# Patient Record
Sex: Female | Born: 1980 | Race: Black or African American | Hispanic: No | Marital: Single | Smoking: Current every day smoker
Health system: Southern US, Community
[De-identification: ages and names within clinical notes are randomized; demographics above are authoritative.]

## PROBLEM LIST (undated history)

## (undated) ENCOUNTER — Ambulatory Visit: Admission: EM | Payer: Self-pay

## (undated) ENCOUNTER — Ambulatory Visit: Admission: EM | Payer: BC Managed Care – PPO | Source: Home / Self Care

## (undated) DIAGNOSIS — I10 Essential (primary) hypertension: Secondary | ICD-10-CM

## (undated) DIAGNOSIS — D259 Leiomyoma of uterus, unspecified: Secondary | ICD-10-CM

## (undated) DIAGNOSIS — N920 Excessive and frequent menstruation with regular cycle: Secondary | ICD-10-CM

## (undated) DIAGNOSIS — N83209 Unspecified ovarian cyst, unspecified side: Secondary | ICD-10-CM

## (undated) DIAGNOSIS — K4090 Unilateral inguinal hernia, without obstruction or gangrene, not specified as recurrent: Secondary | ICD-10-CM

## (undated) DIAGNOSIS — U071 COVID-19: Secondary | ICD-10-CM

## (undated) DIAGNOSIS — D649 Anemia, unspecified: Secondary | ICD-10-CM

## (undated) HISTORY — DX: Excessive and frequent menstruation with regular cycle: N92.0

## (undated) HISTORY — PX: OVARIAN CYST SURGERY: SHX726

## (undated) HISTORY — DX: Unspecified ovarian cyst, unspecified side: N83.209

## (undated) HISTORY — DX: Leiomyoma of uterus, unspecified: D25.9

## (undated) HISTORY — DX: Unilateral inguinal hernia, without obstruction or gangrene, not specified as recurrent: K40.90

---

## 2007-01-29 ENCOUNTER — Emergency Department: Payer: Self-pay | Admitting: Emergency Medicine

## 2007-09-27 ENCOUNTER — Ambulatory Visit: Payer: Self-pay | Admitting: Family Medicine

## 2007-11-01 ENCOUNTER — Emergency Department: Payer: Self-pay | Admitting: Emergency Medicine

## 2012-11-22 ENCOUNTER — Ambulatory Visit: Payer: Self-pay | Admitting: Emergency Medicine

## 2013-10-20 ENCOUNTER — Ambulatory Visit: Payer: Self-pay | Admitting: Physician Assistant

## 2013-10-20 LAB — URINALYSIS, COMPLETE
Bilirubin,UR: NEGATIVE
Blood: NEGATIVE
Glucose,UR: NEGATIVE mg/dL (ref 0–75)
Ketone: NEGATIVE
NITRITE: POSITIVE
Ph: 7.5 (ref 4.5–8.0)
Protein: NEGATIVE
Specific Gravity: 1.005 (ref 1.003–1.030)

## 2013-10-22 LAB — URINE CULTURE

## 2013-11-21 ENCOUNTER — Ambulatory Visit: Payer: Self-pay | Admitting: Family Medicine

## 2013-11-21 LAB — PREGNANCY, URINE: Pregnancy Test, Urine: NEGATIVE m[IU]/mL

## 2020-08-09 ENCOUNTER — Ambulatory Visit
Admission: EM | Admit: 2020-08-09 | Discharge: 2020-08-09 | Disposition: A | Discharge status: Home / Self Care | Payer: BC Managed Care – PPO | Attending: Family Medicine | Admitting: Family Medicine

## 2020-08-09 ENCOUNTER — Other Ambulatory Visit: Payer: Self-pay

## 2020-08-09 DIAGNOSIS — U071 COVID-19: Secondary | ICD-10-CM

## 2020-08-09 LAB — RESP PANEL BY RT-PCR (FLU A&B, COVID) ARPGX2
Influenza A by PCR: NEGATIVE
Influenza B by PCR: NEGATIVE
SARS Coronavirus 2 by RT PCR: POSITIVE — AB

## 2020-08-09 MED ORDER — BENZONATATE 200 MG PO CAPS: 200.0000 mg | ORAL_CAPSULE | Freq: Three times a day (TID) | ORAL | 0 refills | Status: DC | PRN

## 2020-08-09 NOTE — Discharge Instructions (Addendum)
We will call with the results.  Cough medication as directed.  Take care  Dr. Lacinda Axon

## 2020-08-09 NOTE — ED Provider Notes (Signed)
[facility]    CSN: [id number] Arrival date & time: 08/09/20  1452      History   Chief Complaint Chief Complaint  Patient presents with  . Cough    HPI  39 year old female presents with Covid symptoms.  Patient reports her symptoms started on Christmas day.  She reports productive cough, congestion, runny nose, left ear pain, body aches, hot and cold spells.  Denies shortness of breath, fever, nausea, vomit, diarrhea, abdominal pain.  No relieving factors.  She is concerned she may have Covid.  Desires testing today.  No other complaints.  Home Medications    Prior to Admission medications   Medication Sig Start Date End Date Taking? Authorizing Provider  benzonatate (TESSALON) 200 MG capsule Take 1 capsule (200 mg total) by mouth 3 (three) times daily as needed for cough. 08/09/20  Yes Malachy Coleman G, DO  ibuprofen (ADVIL) 600 MG tablet 200 mg   Yes [provider]    Family History Family History  Problem Relation Age of Onset  . Healthy Mother     Social History Social History   Tobacco Use  . Smoking status: Current Every Day Smoker    Packs/day: 0.50    Types: Cigars  . Smokeless tobacco: Never Used  Vaping Use  . Vaping Use: Never used  Substance Use Topics  . Alcohol use: Yes  . Drug use: Never     Allergies   Hornet venom   Review of Systems Review of Systems Per HPI  Physical Exam Triage Vital Signs ED Triage Vitals  Enc Vitals Group     BP 08/09/20 1842 (!) 129/95     Pulse Rate 08/09/20 1842 89     Resp 08/09/20 1842 18     Temp 08/09/20 1842 99.1 F (37.3 C)     Temp Source 08/09/20 1842 Oral     SpO2 08/09/20 1842 100 %     Weight --      Height --      Head Circumference --      Peak Flow --      Pain Score 08/09/20 1836 4     Pain Loc --      Pain Edu? --      Excl. in [place]? --    Updated Vital Signs BP (!) 129/95 (BP Location: Left Wrist)   Pulse 89   Temp 99.1 F (37.3 C) (Oral)   Resp 18   LMP  07/22/2020   SpO2 100%   Visual Acuity Right Eye Distance:   Left Eye Distance:   Bilateral Distance:    Right Eye Near:   Left Eye Near:    Bilateral Near:     Physical Exam Vitals and nursing note reviewed.  Constitutional:      General: She is not in acute distress.    Appearance: Normal appearance. She is not ill-appearing.  HENT:     Head: Normocephalic and atraumatic.  Cardiovascular:     Rate and Rhythm: Normal rate and regular rhythm.     Heart sounds: No murmur heard.   Pulmonary:     Effort: Pulmonary effort is normal.     Breath sounds: Normal breath sounds. No wheezing, rhonchi or rales.  Neurological:     Mental Status: She is alert.  Psychiatric:        Mood and Affect: Mood normal.        Behavior: Behavior normal.    UC Treatments / Results  Labs (  all labs ordered are listed, but only abnormal results are displayed) Labs Reviewed  RESP PANEL BY RT-PCR (FLU A&B, COVID) ARPGX2 - Abnormal; Notable for the following components:      Result Value   SARS Coronavirus 2 by RT PCR POSITIVE (*)    All other components within normal limits    EKG   Radiology No results found.  Procedures Procedures (including critical care time)  Medications Ordered in UC Medications - No data to display  Initial Impression / Assessment and Plan / UC Course  I have reviewed the triage vital signs and the nursing notes.  Pertinent labs & imaging results that were available during my care of the patient were reviewed by me and considered in my medical decision making (see chart for details).    39 year old female presents with COVID-19.  Stable at this time.  Tessalon Perles as prescribed.  Information sent to infusion clinic.  Final Clinical Impressions(s) / UC Diagnoses   Final diagnoses:  COVID     Discharge Instructions     We will call with the results.  Cough medication as directed.  Take care  Dr. Lacinda Axon     ED Prescriptions    Medication Sig  Dispense Auth. Provider   benzonatate (TESSALON) 200 MG capsule Take 1 capsule (200 mg total) by mouth 3 (three) times daily as needed for cough. 30 capsule Coral Spikes, DO     PDMP not reviewed this encounter.   Coral Spikes, [place] 08/09/20 2150

## 2020-08-09 NOTE — ED Triage Notes (Signed)
Pt c/o productive cough with clear/white sputum, congestion, runny nose intermittent left ear pain for approx 3 days.  Had [place] screening on Thursday for employment, but was asymptomatic at the time (result pending).  Denies SOB, fever, n/v/d, abdominal pain, sore throat.

## 2020-08-09 NOTE — ED Notes (Signed)
Called pt for tx room assignment. Pt states she went home but "will drive back". Advised pt that we will have to assign room to pt who is present on campus. Pt can return and be called when a room is available, but must be present to have room assignment.

## 2020-08-18 ENCOUNTER — Ambulatory Visit
Admission: EM | Admit: 2020-08-18 | Discharge: 2020-08-18 | Disposition: A | Discharge status: Home / Self Care | Payer: BC Managed Care – PPO | Attending: Emergency Medicine | Admitting: Emergency Medicine

## 2020-08-18 ENCOUNTER — Other Ambulatory Visit: Payer: Self-pay

## 2020-08-18 DIAGNOSIS — U071 COVID-19: Secondary | ICD-10-CM

## 2020-08-18 MED ORDER — BENZONATATE 100 MG PO CAPS: 200.0000 mg | ORAL_CAPSULE | Freq: Three times a day (TID) | ORAL | 0 refills | Status: DC

## 2020-08-18 NOTE — Discharge Instructions (Addendum)
Use the Tessalon Perles every 8 hours as needed for cough.  Your Covid symptoms of fatigue and cough can last for 6 weeks or longer after you recover from COVID-19.  If your fatigue and cough last longer than 6 weeks to talk to your primary care provider about being referred to one of the Covid long-haul clinics.

## 2020-08-18 NOTE — ED Provider Notes (Signed)
[facility]    CSN: [id number] Arrival date & time: 08/18/20  1703      History   Chief Complaint Chief Complaint  Patient presents with  . Covid Positive    [phone]    HPI Nicole [name] is a 40 y.o. female.   HPI   40 year old female here for evaluation of continued cough and fatigue.  Patient is on day 10 of her Covid quarantine of [place] posted back to work tomorrow.  Patient says she is just not quite feeling 100%.  Patient denies fever, shortness of breath, or wheezing.  History reviewed. No pertinent past medical history.  There are no problems to display for this patient.   History reviewed. No pertinent surgical history.  OB History   No obstetric history on file.      Home Medications    Prior to Admission medications   Medication Sig Start Date End Date Taking? Authorizing Provider  benzonatate (TESSALON) 100 MG capsule Take 2 capsules (200 mg total) by mouth every 8 (eight) hours. 08/18/20  Yes Margarette Canada, NP    Family History Family History  Problem Relation Age of Onset  . Healthy Mother     Social History Social History   Tobacco Use  . Smoking status: Current Every Day Smoker    Packs/day: 0.50    Types: Cigars  . Smokeless tobacco: Never Used  Vaping Use  . Vaping Use: Never used  Substance Use Topics  . Alcohol use: Yes  . Drug use: Never     Allergies   Hornet venom   Review of Systems Review of Systems  Constitutional: Positive for fatigue.  Respiratory: Positive for cough. Negative for shortness of breath.   Cardiovascular: Negative for chest pain.  Neurological: Negative for headaches.  Hematological: Negative.   Psychiatric/Behavioral: Negative.      Physical Exam Triage Vital Signs ED Triage Vitals  Enc Vitals Group     BP 08/18/20 2015 135/87     Pulse Rate 08/18/20 2015 95     Resp 08/18/20 2015 18     Temp 08/18/20 2015 98.4 F (36.9 C)     Temp src --      SpO2 08/18/20 2015 100 %      Weight 08/18/20 1900 200 lb (90.7 kg)     Height 08/18/20 1900 5\' 6"  (1.676 m)     Head Circumference --      Peak Flow --      Pain Score 08/18/20 1859 5     Pain Loc --      Pain Edu? --      Excl. in [place]? --    No data found.  Updated Vital Signs BP 135/87 (BP Location: Right Arm)   Pulse 95   Temp 98.4 F (36.9 C)   Resp 18   Ht 5\' 6"  (1.676 m)   Wt 200 lb (90.7 kg)   LMP 07/22/2020   SpO2 100%   BMI 32.28 kg/m   Visual Acuity Right Eye Distance:   Left Eye Distance:   Bilateral Distance:    Right Eye Near:   Left Eye Near:    Bilateral Near:     Physical Exam Vitals and nursing note reviewed.  Constitutional:      General: She is not in acute distress.    Appearance: Normal appearance. She is normal weight. She is not toxic-appearing.  HENT:     Head: Normocephalic and atraumatic.  Cardiovascular:  Rate and Rhythm: Normal rate and regular rhythm.     Pulses: Normal pulses.     Heart sounds: Normal heart sounds. No murmur heard. No gallop.   Pulmonary:     Effort: Pulmonary effort is normal.     Breath sounds: Normal breath sounds. No wheezing, rhonchi or rales.  Skin:    General: Skin is warm and dry.     Capillary Refill: Capillary refill takes less than 2 seconds.  Neurological:     General: No focal deficit present.     Mental Status: She is alert and oriented to person, place, and time.  Psychiatric:        Mood and Affect: Mood normal.        Behavior: Behavior normal.        Thought Content: Thought content normal.        Judgment: Judgment normal.      UC Treatments / Results  Labs (all labs ordered are listed, but only abnormal results are displayed) Labs Reviewed - No data to display  EKG   Radiology No results found.  Procedures Procedures (including critical care time)  Medications Ordered in UC Medications - No data to display  Initial Impression / Assessment and Plan / UC Course  I have reviewed the triage vital  signs and the nursing notes.  Pertinent labs & imaging results that were available during my care of the patient were reviewed by me and considered in my medical decision making (see chart for details).   Patient is here for evaluation of continued cough and fatigue after 10 days of Covid.  Patient denies fever, shortness of breath or wheezing.  Patient is in no acute distress, not tachypneic, and lung sounds are clear in all lung fields.  Discussed with patient that cough and fatigue can last for 6 weeks or longer after Covid diagnosis.   Final Clinical Impressions(s) / UC Diagnoses   Final diagnoses:  COVID-19     Discharge Instructions     Use the Tessalon Perles every 8 hours as needed for cough.  Your Covid symptoms of fatigue and cough can last for 6 weeks or longer after you recover from COVID-19.  If your fatigue and cough last longer than 6 weeks to talk to your primary care provider about being referred to one of the Covid long-haul clinics.    ED Prescriptions    Medication Sig Dispense Auth. Provider   benzonatate (TESSALON) 100 MG capsule Take 2 capsules (200 mg total) by mouth every 8 (eight) hours. 21 capsule Becky Augusta, NP     PDMP not reviewed this encounter.   Becky Augusta, NP 08/18/20 2122

## 2020-08-18 NOTE — ED Triage Notes (Signed)
Patient states that she has been having a cough with nasal congestion. States that she is covid positive and is on day 10 of symptoms. States that she is supposed to return to work tomorrow but isnt feeling 100%

## 2020-09-07 ENCOUNTER — Ambulatory Visit
Admission: EM | Admit: 2020-09-07 | Discharge: 2020-09-07 | Disposition: A | Discharge status: Home / Self Care | Payer: Medicaid Other | Attending: Family Medicine | Admitting: Family Medicine

## 2020-09-07 ENCOUNTER — Other Ambulatory Visit: Payer: Self-pay

## 2020-09-07 DIAGNOSIS — S83412A Sprain of medial collateral ligament of left knee, initial encounter: Secondary | ICD-10-CM

## 2020-09-07 MED ORDER — MELOXICAM 15 MG PO TABS: 15.0000 mg | ORAL_TABLET | Freq: Every day | ORAL | 0 refills | Status: DC | PRN

## 2020-09-07 NOTE — Discharge Instructions (Signed)
Rest, ice, elevation.  Medication as prescribed.  Take care  Dr. Lacinda Axon

## 2020-09-07 NOTE — ED Provider Notes (Signed)
[facility]    CSN: [id number] Arrival date & time: 09/07/20  1224  History   Chief Complaint Chief Complaint  Patient presents with  . Knee Pain   HPI  40 year old female presents with the above complaint.  Patient states that she slipped on some ice and had a fall last night.  She states that she fell on her bottom but her leg got underneath her.  She reports left knee pain.  Pain is located medially.  Patient states that she is concerned that she has sprained her knee.  Pain is 5/10 in severity.  No relieving factors.  No other reported symptoms.  No other complaints.   Home Medications    Prior to Admission medications   Medication Sig Start Date End Date Taking? Authorizing Provider  meloxicam (MOBIC) 15 MG tablet Take 1 tablet (15 mg total) by mouth daily as needed. 09/07/20  Yes Coral Spikes, DO    Family History Family History  Problem Relation Age of Onset  . Healthy Mother     Social History Social History   Tobacco Use  . Smoking status: Current Every Day Smoker    Packs/day: 0.50    Types: Cigars  . Smokeless tobacco: Never Used  Vaping Use  . Vaping Use: Never used  Substance Use Topics  . Alcohol use: Yes  . Drug use: Never     Allergies   Hornet venom   Review of Systems Review of Systems  Constitutional: Negative.   Musculoskeletal:       Left knee pain.   Physical Exam Triage Vital Signs ED Triage Vitals  Enc Vitals Group     BP 09/07/20 1232 (!) 145/84     Pulse Rate 09/07/20 1232 88     Resp 09/07/20 1232 18     Temp 09/07/20 1232 98.3 F (36.8 C)     Temp Source 09/07/20 1232 Oral     SpO2 09/07/20 1232 100 %     Weight 09/07/20 1232 203 lb (92.1 kg)     Height 09/07/20 1232 5\' 6"  (1.676 m)     Head Circumference --      Peak Flow --      Pain Score 09/07/20 1231 5     Pain Loc --      Pain Edu? --      Excl. in [place]? --    Updated Vital Signs BP (!) 145/84   Pulse 88   Temp 98.3 F (36.8 C) (Oral)   Resp  18   Ht 5\' 6"  (1.676 m)   Wt 92.1 kg   SpO2 100%   BMI 32.77 kg/m   Visual Acuity Right Eye Distance:   Left Eye Distance:   Bilateral Distance:    Right Eye Near:   Left Eye Near:    Bilateral Near:     Physical Exam Vitals and nursing note reviewed.  Constitutional:      General: She is not in acute distress.    Appearance: She is not ill-appearing.  HENT:     Head: Normocephalic and atraumatic.  Eyes:     General:        Right eye: No discharge.        Left eye: No discharge.     Conjunctiva/sclera: Conjunctivae normal.  Pulmonary:     Effort: Pulmonary effort is normal. No respiratory distress.  Musculoskeletal:     Comments: Left knee - tenderness medially. No effusion.  Neurological:  Mental Status: She is alert.  Psychiatric:        Mood and Affect: Mood normal.        Behavior: Behavior normal.    UC Treatments / Results  Labs (all labs ordered are listed, but only abnormal results are displayed) Labs Reviewed - No data to display  EKG   Radiology No results found.  Procedures Procedures (including critical care time)  Medications Ordered in UC Medications - No data to display  Initial Impression / Assessment and Plan / UC Course  I have reviewed the triage vital signs and the nursing notes.  Pertinent labs & imaging results that were available during my care of the patient were reviewed by me and considered in my medical decision making (see chart for details).    40 year old female presents with an MCL sprain. Advised rest, ice, elevation. Mobic as directed.   Final Clinical Impressions(s) / UC Diagnoses   Final diagnoses:  Sprain of medial collateral ligament of left knee, initial encounter     Discharge Instructions     Rest, ice, elevation.  Medication as prescribed.  Take care  Dr. Lacinda Axon    ED Prescriptions    Medication Sig Dispense Auth. Provider   meloxicam (MOBIC) 15 MG tablet Take 1 tablet (15 mg total) by mouth  daily as needed. 30 tablet Coral Spikes, DO     PDMP not reviewed this encounter.   Coral Spikes, [place] 09/07/20 1504

## 2020-09-07 NOTE — ED Triage Notes (Signed)
Pt reports having L knee pain s/p fall last night. sts knee feels like it is sprained. Tried to ice knee and use otc meds without relief.  Slip and fall due to the ice.

## 2020-09-11 ENCOUNTER — Ambulatory Visit
Admission: EM | Admit: 2020-09-11 | Discharge: 2020-09-11 | Disposition: A | Discharge status: Home / Self Care | Payer: Medicaid Other

## 2020-09-11 ENCOUNTER — Other Ambulatory Visit: Payer: Self-pay

## 2020-09-11 ENCOUNTER — Encounter: Payer: Self-pay | Admitting: Emergency Medicine

## 2020-09-11 DIAGNOSIS — S83412D Sprain of medial collateral ligament of left knee, subsequent encounter: Secondary | ICD-10-CM

## 2020-09-11 NOTE — ED Provider Notes (Signed)
[facility]    CSN: [id number] Arrival date & time: 09/11/20  1521      History   Chief Complaint Chief Complaint  Patient presents with  . Knee Pain    left    HPI Nicole [name] is a 40 y.o. female.   HPI   40 year old female here for reevaluation of left knee sprain.  Patient was seen and evaluated on 09/07/2020 and diagnosed with a medial collateral ligament sprain of her left knee.  Patient reports that initially there was swelling but since she has been keeping it elevated, taking the meloxicam, and resting the swelling has gone down and the pain has greatly improved.  Patient states that she does not feel any instability in her knee, does not have any numbness or tingling in her foot or lower leg, and has not been wearing a brace.  History reviewed. No pertinent past medical history.  There are no problems to display for this patient.   History reviewed. No pertinent surgical history.  OB History   No obstetric history on file.      Home Medications    Prior to Admission medications   Medication Sig Start Date End Date Taking? Authorizing Provider  meloxicam (MOBIC) 15 MG tablet Take 1 tablet (15 mg total) by mouth daily as needed. 09/07/20  Yes Coral Spikes, DO    Family History Family History  Problem Relation Age of Onset  . Healthy Mother     Social History Social History   Tobacco Use  . Smoking status: Current Every Day Smoker    Packs/day: 0.50    Types: Cigars  . Smokeless tobacco: Never Used  Vaping Use  . Vaping Use: Never used  Substance Use Topics  . Alcohol use: Yes  . Drug use: Never     Allergies   Hornet venom   Review of Systems Review of Systems  Constitutional: Negative for activity change, appetite change and fever.  Musculoskeletal: Positive for arthralgias. Negative for joint swelling and myalgias.  Skin: Negative for color change.  Neurological: Negative for weakness and numbness.  Hematological:  Negative.   Psychiatric/Behavioral: Negative.      Physical Exam Triage Vital Signs ED Triage Vitals  Enc Vitals Group     BP 09/11/20 1541 (!) 139/100     Pulse Rate 09/11/20 1541 85     Resp 09/11/20 1541 14     Temp 09/11/20 1541 98.9 F (37.2 C)     Temp Source 09/11/20 1541 Oral     SpO2 09/11/20 1541 100 %     Weight 09/11/20 1539 203 lb (92.1 kg)     Height 09/11/20 1539 5\' 6"  (1.676 m)     Head Circumference --      Peak Flow --      Pain Score 09/11/20 1538 5     Pain Loc --      Pain Edu? --      Excl. in [place]? --    No data found.  Updated Vital Signs BP (!) 139/100 (BP Location: Right Arm)   Pulse 85   Temp 98.9 F (37.2 C) (Oral)   Resp 14   Ht 5\' 6"  (1.676 m)   Wt 203 lb (92.1 kg)   LMP 09/07/2020 (Exact Date)   SpO2 100%   BMI 32.77 kg/m   Visual Acuity Right Eye Distance:   Left Eye Distance:   Bilateral Distance:    Right Eye Near:   Left  Eye Near:    Bilateral Near:     Physical Exam Vitals and nursing note reviewed.  Constitutional:      General: She is not in acute distress.    Appearance: Normal appearance. She is not ill-appearing.  Musculoskeletal:        General: Tenderness present. No swelling. Normal range of motion.  Skin:    General: Skin is warm.     Capillary Refill: Capillary refill takes less than 2 seconds.     Findings: No bruising, erythema or rash.  Neurological:     General: No focal deficit present.     Mental Status: She is alert and oriented to person, place, and time.  Psychiatric:        Mood and Affect: Mood normal.        Behavior: Behavior normal.        Thought Content: Thought content normal.        Judgment: Judgment normal.      UC Treatments / Results  Labs (all labs ordered are listed, but only abnormal results are displayed) Labs Reviewed - No data to display  EKG   Radiology No results found.  Procedures Procedures (including critical care time)  Medications Ordered in  UC Medications - No data to display  Initial Impression / Assessment and Plan / UC Course  I have reviewed the triage vital signs and the nursing notes.  Pertinent labs & imaging results that were available during my care of the patient were reviewed by me and considered in my medical decision making (see chart for details).   Is here for reevaluation of her left knee injury in order to return to work tomorrow.  Patient is in no acute distress.  Patient has very minimal tenderness to palpation over the medial collateral ligament of the left knee.  There is no edema, ecchymosis, or erythema present.  No popping or clicking with range of motion of the knee.  Anterior posterior drawer tests are negative.  No tenderness to the patella.  Has no tenderness with valgus stress application.  Patient does have mild tenderness with varus stress application.  Patient does not walk with a limping gait.  Patient is cleared to return to work.   Final Clinical Impressions(s) / UC Diagnoses   Final diagnoses:  Sprain of medial collateral ligament of left knee, subsequent encounter     Discharge Instructions     You are cleared to return to work.  I would recommend wearing an elastic knee brace for some added support until the pain completely resolves.  Continue to elevate your left knee as much as possible and at the end of your shift.  You may continue to apply ice at the end of your shift for 20 minutes at a time to decrease inflammation.  You may also continue to take the Mobic once daily as needed for controlled information.    ED Prescriptions    None     PDMP not reviewed this encounter.   Margarette Canada, NP 09/11/20 1616

## 2020-09-11 NOTE — Discharge Instructions (Addendum)
You are cleared to return to work.  I would recommend wearing an elastic knee brace for some added support until the pain completely resolves.  Continue to elevate your left knee as much as possible and at the end of your shift.  You may continue to apply ice at the end of your shift for 20 minutes at a time to decrease inflammation.  You may also continue to take the Mobic once daily as needed for controlled information.

## 2020-09-11 NOTE — ED Triage Notes (Signed)
Patient c/o ongoing pain in hier left knee.  Patient was seen here on 09/07/20 for fall and left knee injury.  Patient is suppose to return to work tomorrow and wants to make sure she is okay to return to work.

## 2020-11-11 ENCOUNTER — Ambulatory Visit
Admission: EM | Admit: 2020-11-11 | Discharge: 2020-11-11 | Disposition: A | Discharge status: Home / Self Care | Payer: BC Managed Care – PPO | Attending: Family Medicine | Admitting: Family Medicine

## 2020-11-11 ENCOUNTER — Other Ambulatory Visit: Payer: Self-pay

## 2020-11-11 DIAGNOSIS — B349 Viral infection, unspecified: Secondary | ICD-10-CM | POA: Diagnosis not present

## 2020-11-11 LAB — RAPID INFLUENZA A&B ANTIGENS
Influenza A (ARMC): NEGATIVE
Influenza B (ARMC): NEGATIVE

## 2020-11-11 MED ORDER — PANTOPRAZOLE SODIUM 40 MG PO TBEC: 40.0000 mg | DELAYED_RELEASE_TABLET | Freq: Every day | ORAL | 0 refills | Status: DC

## 2020-11-11 NOTE — ED Triage Notes (Signed)
Pt c/o body aches yesterday, nasal congestion and some abdominal cramps. Pt denies f/n/v/d, cough or other symptoms.

## 2020-11-11 NOTE — Discharge Instructions (Addendum)
Rest. Fluids.  Medication as prescribed.   Take care  Dr. Toy Samarin  

## 2020-11-11 NOTE — ED Provider Notes (Signed)
[facility]    CSN: [id number] Arrival date & time: 11/11/20  1658  History   Chief Complaint Chief Complaint  Patient presents with  . Abdominal Pain   HPI  40 year old female presents with abdominal pain.  Patient reports that her symptoms started yesterday.  She reports that she had some body aches and rested and seemed to improve.  She subsequently developed periumbilical abdominal pain.  No significant respiratory symptoms.  She reports subjective fever but no documented fever.  No reported sick contacts.  No other complaints.  Home Medications    Prior to Admission medications   Medication Sig Start Date End Date Taking? Authorizing Provider  pantoprazole (PROTONIX) 40 MG tablet Take 1 tablet (40 mg total) by mouth daily. 11/11/20  Yes Coral Spikes, DO    Family History Family History  Problem Relation Age of Onset  . Healthy Mother     Social History Social History   Tobacco Use  . Smoking status: Current Every Day Smoker    Packs/day: 0.50    Types: Cigars  . Smokeless tobacco: Never Used  Vaping Use  . Vaping Use: Never used  Substance Use Topics  . Alcohol use: Yes  . Drug use: Never     Allergies   Hornet venom   Review of Systems Review of Systems Per HPI  Physical Exam Triage Vital Signs ED Triage Vitals  Enc Vitals Group     BP 11/11/20 1717 (!) 141/96     Pulse Rate 11/11/20 1717 82     Resp 11/11/20 1717 18     Temp 11/11/20 1717 99.3 F (37.4 C)     Temp Source 11/11/20 1717 Oral     SpO2 11/11/20 1717 100 %     Weight 11/11/20 1715 205 lb (93 kg)     Height 11/11/20 1715 5\' 6"  (1.676 m)     Head Circumference --      Peak Flow --      Pain Score 11/11/20 1714 5     Pain Loc --      Pain Edu? --      Excl. in [name]? --    Updated Vital Signs BP (!) 141/96 (BP Location: Left Arm)   Pulse 82   Temp 99.3 F (37.4 C) (Oral)   Resp 18   Ht 5\' 6"  (1.676 m)   Wt 93 kg   LMP 11/02/2020 (Approximate)   SpO2 100%    BMI 33.09 kg/m   Visual Acuity Right Eye Distance:   Left Eye Distance:   Bilateral Distance:    Right Eye Near:   Left Eye Near:    Bilateral Near:     Physical Exam Vitals and nursing note reviewed.  Constitutional:      General: She is not in acute distress.    Appearance: Normal appearance. She is not ill-appearing.  HENT:     Head: Normocephalic and atraumatic.  Eyes:     General:        Right eye: No discharge.        Left eye: No discharge.     Conjunctiva/sclera: Conjunctivae normal.  Cardiovascular:     Rate and Rhythm: Normal rate and regular rhythm.  Pulmonary:     Effort: Pulmonary effort is normal.     Breath sounds: Normal breath sounds. No wheezing, rhonchi or rales.  Abdominal:     General: There is no distension.     Palpations: Abdomen is soft.  Comments: Mild epigastric tenderness to palpation.  Neurological:     Mental Status: She is alert.  Psychiatric:        Mood and Affect: Mood normal.        Behavior: Behavior normal.    UC Treatments / Results  Labs (all labs ordered are listed, but only abnormal results are displayed) Labs Reviewed  RAPID INFLUENZA A&B ANTIGENS    EKG   Radiology No results found.  Procedures Procedures (including critical care time)  Medications Ordered in UC Medications - No data to display  Initial Impression / Assessment and Plan / UC Course  I have reviewed the triage vital signs and the nursing notes.  Pertinent labs & imaging results that were available during my care of the patient were reviewed by me and considered in my medical decision making (see chart for details).    40 year old female presents with a viral illness.  Protonix as needed given her epigastric pain.  Flu negative.  Supportive care.  Work note given  Final Clinical Impressions(s) / UC Diagnoses   Final diagnoses:  Viral illness     Discharge Instructions     Rest. Fluids.  Medication as prescribed.  Take  care  Dr. Lacinda Axon    ED Prescriptions    Medication Sig Dispense Auth. Provider   pantoprazole (PROTONIX) 40 MG tablet Take 1 tablet (40 mg total) by mouth daily. 30 tablet Coral Spikes, DO     PDMP not reviewed this encounter.   Coral Spikes, [place] 11/11/20 1909

## 2020-11-15 ENCOUNTER — Encounter: Payer: Self-pay | Admitting: Emergency Medicine

## 2020-11-15 ENCOUNTER — Other Ambulatory Visit: Payer: Self-pay

## 2020-11-15 ENCOUNTER — Emergency Department
Admission: EM | Admit: 2020-11-15 | Discharge: 2020-11-16 | Disposition: A | Discharge status: Home / Self Care | Payer: BC Managed Care – PPO | Attending: Emergency Medicine | Admitting: Emergency Medicine

## 2020-11-15 ENCOUNTER — Ambulatory Visit (INDEPENDENT_AMBULATORY_CARE_PROVIDER_SITE_OTHER)
Admission: EM | Admit: 2020-11-15 | Discharge: 2020-11-15 | Disposition: A | Discharge status: Home / Self Care | Payer: BC Managed Care – PPO | Source: Home / Self Care

## 2020-11-15 DIAGNOSIS — R1909 Other intra-abdominal and pelvic swelling, mass and lump: Secondary | ICD-10-CM

## 2020-11-15 DIAGNOSIS — I10 Essential (primary) hypertension: Secondary | ICD-10-CM | POA: Insufficient documentation

## 2020-11-15 DIAGNOSIS — F17290 Nicotine dependence, other tobacco product, uncomplicated: Secondary | ICD-10-CM | POA: Diagnosis not present

## 2020-11-15 DIAGNOSIS — N939 Abnormal uterine and vaginal bleeding, unspecified: Secondary | ICD-10-CM | POA: Insufficient documentation

## 2020-11-15 DIAGNOSIS — R1903 Right lower quadrant abdominal swelling, mass and lump: Secondary | ICD-10-CM | POA: Insufficient documentation

## 2020-11-15 DIAGNOSIS — R1031 Right lower quadrant pain: Secondary | ICD-10-CM | POA: Insufficient documentation

## 2020-11-15 LAB — COMPREHENSIVE METABOLIC PANEL
ALT: 16 U/L (ref 0–44)
AST: 22 U/L (ref 15–41)
Albumin: 4.2 g/dL (ref 3.5–5.0)
Alkaline Phosphatase: 47 U/L (ref 38–126)
Anion gap: 7 (ref 5–15)
BUN: 12 mg/dL (ref 6–20)
CO2: 22 mmol/L (ref 22–32)
Calcium: 8.8 mg/dL — ABNORMAL LOW (ref 8.9–10.3)
Chloride: 106 mmol/L (ref 98–111)
Creatinine, Ser: 0.59 mg/dL (ref 0.44–1.00)
GFR, Estimated: >60 mL/min (ref >60)
Glucose, Bld: 102 mg/dL — ABNORMAL HIGH (ref 70–99)
Potassium: 3.7 mmol/L (ref 3.5–5.1)
Sodium: 135 mmol/L (ref 135–145)
Total Bilirubin: 0.4 mg/dL (ref 0.3–1.2)
Total Protein: 7.9 g/dL (ref 6.5–8.1)

## 2020-11-15 LAB — WET PREP, GENITAL
Clue Cells Wet Prep HPF POC: NONE SEEN
Sperm: NONE SEEN
Trich, Wet Prep: NONE SEEN
Yeast Wet Prep HPF POC: NONE SEEN

## 2020-11-15 LAB — URINALYSIS, ROUTINE W REFLEX MICROSCOPIC
Bacteria, UA: NONE SEEN
Bilirubin Urine: NEGATIVE
Glucose, UA: NEGATIVE mg/dL
Ketones, ur: NEGATIVE mg/dL
Leukocytes,Ua: NEGATIVE
Nitrite: NEGATIVE
Protein, ur: NEGATIVE mg/dL
Specific Gravity, Urine: 1.009 (ref 1.005–1.030)
pH: 7 (ref 5.0–8.0)

## 2020-11-15 LAB — URINALYSIS, COMPLETE (UACMP) WITH MICROSCOPIC

## 2020-11-15 LAB — CBC
HCT: 32 % — ABNORMAL LOW (ref 36.0–46.0)
Hemoglobin: 9.1 g/dL — ABNORMAL LOW (ref 12.0–15.0)
MCH: 20.6 pg — ABNORMAL LOW (ref 26.0–34.0)
MCHC: 28.4 g/dL — ABNORMAL LOW (ref 30.0–36.0)
MCV: 72.6 fL — ABNORMAL LOW (ref 80.0–100.0)
Platelets: 327 10*3/uL (ref 150–400)
RBC: 4.41 MIL/uL (ref 3.87–5.11)
RDW: 18.6 % — ABNORMAL HIGH (ref 11.5–15.5)
WBC: 6.8 10*3/uL (ref 4.0–10.5)
nRBC: 0 % (ref 0.0–0.2)

## 2020-11-15 LAB — POC URINE PREG, ED: Preg Test, Ur: NEGATIVE

## 2020-11-15 NOTE — ED Provider Notes (Addendum)
[facility]    CSN: [id number] Arrival date & time: 11/15/20  1848      History   Chief Complaint Chief Complaint  Patient presents with  . Abdominal Pain    RLQ    HPI Nicole [name] is a 40 y.o. female.   HPI   40 year old female here for evaluation of abdominal pain and vaginal bleeding.  Patient reports that she is having pain and has a tender swollen area in her right lower quadrant of her abdomen that started 4 days ago.  Patient reports that she has also had some vaginal bleeding.  Patient reports that she had a normal menses on 11/02/2020 that lasted 7 days.  Her menses had finished for 2 days and then the painful swelling developed in her right lower quadrant and she started having vaginal bleeding again.  Patient denies fever, urinary complaints, or vaginal discharge.  Patient states she has a history of ovarian cysts and is concerned that this may be a cyst.  Patient also has a history of uterine fibroids which give her heavy menstrual cycles.  History reviewed. No pertinent past medical history.  There are no problems to display for this patient.   History reviewed. No pertinent surgical history.  OB History   No obstetric history on file.      Home Medications    Prior to Admission medications   Medication Sig Start Date End Date Taking? Authorizing Provider  pantoprazole (PROTONIX) 40 MG tablet Take 1 tablet (40 mg total) by mouth daily. 11/11/20 11/15/20  Coral Spikes, DO    Family History Family History  Problem Relation Age of Onset  . Healthy Mother     Social History Social History   Tobacco Use  . Smoking status: Current Every Day Smoker    Packs/day: 0.50    Types: Cigars  . Smokeless tobacco: Never Used  Vaping Use  . Vaping Use: Never used  Substance Use Topics  . Alcohol use: Yes  . Drug use: Never     Allergies   Hornet venom   Review of Systems Review of Systems  Constitutional: Negative for fever.   Respiratory: Negative for shortness of breath.   Gastrointestinal: Positive for abdominal pain. Negative for diarrhea, nausea and vomiting.  Genitourinary: Positive for menstrual problem and vaginal bleeding. Negative for dysuria, frequency, hematuria, pelvic pain, urgency, vaginal discharge and vaginal pain.  Skin: Negative for rash.  Neurological: Negative for dizziness and syncope.  Hematological: Negative.   Psychiatric/Behavioral: Negative.      Physical Exam Triage Vital Signs ED Triage Vitals  Enc Vitals Group     BP      Pulse      Resp      Temp      Temp src      SpO2      Weight      Height      Head Circumference      Peak Flow      Pain Score      Pain Loc      Pain Edu?      Excl. in [place]?    No data found.  Updated Vital Signs BP 125/87 (BP Location: Left Arm)   Pulse 95   Temp 99.2 F (37.3 C) (Oral)   Resp 14   Ht 5\' 6"  (1.676 m)   Wt 205 lb (93 kg)   LMP 11/02/2020 (Approximate)   SpO2 100%   BMI 33.09 kg/m  Visual Acuity Right Eye Distance:   Left Eye Distance:   Bilateral Distance:    Right Eye Near:   Left Eye Near:    Bilateral Near:     Physical Exam Vitals and nursing note reviewed.  Constitutional:      General: She is not in acute distress.    Appearance: She is well-developed. She is not ill-appearing.  HENT:     Head: Normocephalic and atraumatic.  Cardiovascular:     Rate and Rhythm: Normal rate and regular rhythm.     Heart sounds: Normal heart sounds. No murmur heard. No gallop.   Pulmonary:     Effort: Pulmonary effort is normal.     Breath sounds: Normal breath sounds. No wheezing, rhonchi or rales.  Abdominal:     General: Abdomen is flat. Bowel sounds are normal. There is no distension.     Palpations: Abdomen is soft. There is no hepatomegaly or splenomegaly.     Tenderness: There is no abdominal tenderness.     Hernia: No hernia is present.  Skin:    General: Skin is warm and dry.     Capillary Refill:  Capillary refill takes less than 2 seconds.     Findings: No erythema or rash.  Neurological:     General: No focal deficit present.     Mental Status: She is alert and oriented to person, place, and time.  Psychiatric:        Mood and Affect: Mood normal.        Behavior: Behavior normal.      UC Treatments / Results  Labs (all labs ordered are listed, but only abnormal results are displayed) Labs Reviewed  WET PREP, GENITAL - Abnormal; Notable for the following components:      Result Value   WBC, Wet Prep HPF POC FEW (*)    All other components within normal limits  URINALYSIS, COMPLETE (UACMP) WITH MICROSCOPIC - Abnormal; Notable for the following components:   Color, Urine ORANGE (*)    Glucose, UA   (*)    Value: TEST NOT REPORTED DUE TO COLOR INTERFERENCE OF URINE PIGMENT   Hgb urine dipstick   (*)    Value: TEST NOT REPORTED DUE TO COLOR INTERFERENCE OF URINE PIGMENT   Bilirubin Urine   (*)    Value: TEST NOT REPORTED DUE TO COLOR INTERFERENCE OF URINE PIGMENT   Ketones, ur   (*)    Value: TEST NOT REPORTED DUE TO COLOR INTERFERENCE OF URINE PIGMENT   Protein, ur   (*)    Value: TEST NOT REPORTED DUE TO COLOR INTERFERENCE OF URINE PIGMENT   Nitrite   (*)    Value: TEST NOT REPORTED DUE TO COLOR INTERFERENCE OF URINE PIGMENT   Leukocytes,Ua   (*)    Value: TEST NOT REPORTED DUE TO COLOR INTERFERENCE OF URINE PIGMENT   Bacteria, UA FEW (*)    All other components within normal limits    EKG   Radiology CT ABDOMEN PELVIS W CONTRAST  Result Date: 11/16/2020 CLINICAL DATA:  Right lower quadrant abdominal pain starting on Saturday. Vaginal bleeding and abdominal cramping. EXAM: CT ABDOMEN AND PELVIS WITH CONTRAST TECHNIQUE: Multidetector CT imaging of the abdomen and pelvis was performed using the standard protocol following bolus administration of intravenous contrast. CONTRAST:  188mL OMNIPAQUE IOHEXOL 300 MG/ML  SOLN COMPARISON:  None. FINDINGS: Lower chest: Motion  artifact limits examination. Lung bases appear clear. Hepatobiliary: No focal liver abnormality is seen. No gallstones,  gallbladder wall thickening, or biliary dilatation. Pancreas: Unremarkable. No pancreatic ductal dilatation or surrounding inflammatory changes. Spleen: Normal in size without focal abnormality. Adrenals/Urinary Tract: Adrenal glands are unremarkable. Kidneys are normal, without renal calculi, focal lesion, or hydronephrosis. Bladder is unremarkable. Stomach/Bowel: Stomach, small bowel, and colon are not abnormally distended. No wall thickening or inflammatory changes are appreciated. The appendix is normal. Vascular/Lymphatic: Normal caliber abdominal aorta. Scattered retroperitoneal and mesenteric lymph nodes without pathologic enlargement, likely reactive. Reproductive: Nodular enlargement of the uterus consistent with uterine fibroids. Largest measures about 6.4 cm in diameter. Right adnexal cyst measuring 4.4 cm diameter. Other: No free air or free fluid in the abdomen. Moderate supraumbilical anterior abdominal wall hernias containing fat. Heterogeneous mass like soft tissue lesion demonstrated in the right inguinal canal, measuring 3 x 4.6 cm. This could represent a hernia with fat necrosis, enlarged lymph node, or an inguinal mass lesion. No adjacent bowel is identified to suggest bowel herniation. Musculoskeletal: No acute or significant osseous findings. IMPRESSION: 1. Moderate supraumbilical anterior abdominal wall hernias containing fat. 2. Heterogeneous mass like soft tissue lesion in the right inguinal canal, measuring 3 x 4.6 cm. This could represent a hernia with fat necrosis, enlarged lymph node, or an inguinal mass lesion. Surgical consultation suggested. 3. Uterine fibroids. 4. Right adnexal cyst measuring 4.4 cm diameter. No follow-up imaging recommended. note: This recommendation does not apply to premenarchal patients and to those with increased risk (genetic, family history,  elevated tumor markers or other high-risk factors) of ovarian cancer. Reference: JACR 2020 Feb; 17(2):248-254 Electronically Signed   By: Lucienne Capers M.D.   On: 11/16/2020 02:10    Procedures Procedures (including critical care time)  Medications Ordered in UC Medications - No data to display  Initial Impression / Assessment and Plan / UC Course  I have reviewed the triage vital signs and the nursing notes.  Pertinent labs & imaging results that were available during my care of the patient were reviewed by me and considered in my medical decision making (see chart for details).   Patient is a very pleasant 40 year old female here for evaluation of abdominal pain and vaginal bleeding that been going on for last 4 days.  Patient's physical exam reveals pale conjunctiva and oral mucous membranes.  Patient reports she has a history of anemia and she just finished her menstrual cycle 6 days ago.  Patient's cardiopulmonary exam is unremarkable.  Abdomen is soft, nondistended, nontender, with positive bowel sounds in all 4 quadrants.  Patient has a palpable right suprapubic mass on exam that is tender to palpation.  Patient thinks that its an ovarian cyst as she has had these in the past.  UA and wet prep collected at triage.    UA unable to be performed due to color interference.  Results do show 6-10 squamous epithelials 0-5 WBCs, few bacteria.    Wet prep is negative for yeast, trichomoniasis, or clue cells.   Will obtain noncontrasted CT of the abdomen and pelvis.  Patient has been checked into the emergency department at [facility]  Patient was sent over to [facility] to obtain an outpatient CT scan of her abdomen pelvis and due to the late hour had to be registered to the ER.  Patient is listed as waiting in the waiting room.   Final Clinical Impressions(s) / UC Diagnoses   Final diagnoses:  Right lower quadrant abdominal pain    Discharge Instructions   None  ED Prescriptions    None     PDMP not reviewed this encounter.   Margarette Canada, NP 11/15/20 2019    Margarette Canada, NP 11/16/20 [phone]

## 2020-11-15 NOTE — ED Triage Notes (Signed)
Patient c/o RLQ pain that started on Saturday. Patient reports some vaginal bleeding and abdominal cramping.  Patient states that she has history of ovarian cysts.  Patient denies any vaginal discharge.

## 2020-11-15 NOTE — ED Triage Notes (Addendum)
Pt in with co lump to right groin area states since Saturday. Did have viral infection last week, went to urgent care of sent here for further evaluation. PT also having some lower abd cramping and some vaginal bleeding.

## 2020-11-16 ENCOUNTER — Emergency Department: Payer: BC Managed Care – PPO

## 2020-11-16 DIAGNOSIS — R1903 Right lower quadrant abdominal swelling, mass and lump: Secondary | ICD-10-CM | POA: Diagnosis not present

## 2020-11-16 MED ORDER — IOHEXOL 300 MG/ML  SOLN
100.0000 mL | Freq: Once | INTRAMUSCULAR | Status: AC | PRN
  Administered 2020-11-16: 100 mL via INTRAVENOUS

## 2020-11-16 NOTE — ED Provider Notes (Signed)
[facility] Emergency Department Provider Note  ____________________________________________   Event Date/Time   First MD Initiated Contact with Patient 11/15/20 2301     (approximate)  I have reviewed the triage vital signs and the nursing notes.   HISTORY  Chief Complaint Groin Pain    HPI Nicole [name] is a 40 y.o. female with no significant past medical history who presents to the emergency department with a mass in the right lower quadrant that she has noticed for the past 4 days.  Was seen today by nurse practitioner urgent care was sent here for CT of the abdomen pelvis.  She has also had some intermittent vaginal bleeding but states that she has very irregular menstrual cycles and this is not abnormal for her.  No fevers, nausea, vomiting, diarrhea, dysuria, hematuria, vaginal discharge.  States this area is slightly tender to palpation but she denies any pain when this area is not being palpated.  She reports normal bowel movements.        No past medical history on file.  There are no problems to display for this patient.   No past surgical history on file.  Prior to Admission medications   Medication Sig Start Date End Date Taking? Authorizing Provider  pantoprazole (PROTONIX) 40 MG tablet Take 1 tablet (40 mg total) by mouth daily. 11/11/20 11/15/20  Coral Spikes, DO    Allergies Hornet venom  Family History  Problem Relation Age of Onset  . Healthy Mother     Social History Social History   Tobacco Use  . Smoking status: Current Every Day Smoker    Packs/day: 0.50    Types: Cigars  . Smokeless tobacco: Never Used  Vaping Use  . Vaping Use: Never used  Substance Use Topics  . Alcohol use: Yes  . Drug use: Never    Review of Systems Constitutional: No fever. Eyes: No visual changes. ENT: No sore throat. Cardiovascular: Denies chest pain. Respiratory: Denies shortness of breath. Gastrointestinal: No nausea, vomiting,  diarrhea. Genitourinary: Negative for dysuria. Musculoskeletal: Negative for back pain. Skin: Negative for rash. Neurological: Negative for focal weakness or numbness.  ____________________________________________   PHYSICAL EXAM:  VITAL SIGNS: ED Triage Vitals  Enc Vitals Group     BP 11/15/20 2019 (!) 141/101     Pulse Rate 11/15/20 2019 86     Resp 11/15/20 2019 20     Temp 11/15/20 2019 98.5 F (36.9 C)     Temp Source 11/15/20 2019 Oral     SpO2 11/15/20 2019 100 %     Weight 11/15/20 2020 205 lb (93 kg)     Height 11/15/20 2020 5\' 6"  (1.676 m)     Head Circumference --      Peak Flow --      Pain Score 11/15/20 2019 0     Pain Loc --      Pain Edu? --      Excl. in [place]? --    CONSTITUTIONAL: Alert and oriented and responds appropriately to questions. Well-appearing; well-nourished HEAD: Normocephalic EYES: Conjunctivae clear, pupils appear equal, EOM appear intact ENT: normal nose; moist mucous membranes NECK: Supple, normal ROM CARD: RRR; S1 and S2 appreciated; no murmurs, no clicks, no rubs, no gallops RESP: Normal chest excursion without splinting or tachypnea; breath sounds clear and equal bilaterally; no wheezes, no rhonchi, no rales, no hypoxia or respiratory distress, speaking full sentences ABD/GI: Normal bowel sounds; non-distended; soft, no rebound, no guarding, no  peritoneal signs, no hepatosplenomegaly, patient has a right inguinal hernia on exam that I am not able to reduce, mildly tender to palpation over the right hernia with no overlying skin changes BACK: The back appears normal EXT: Normal ROM in all joints; no deformity noted, no edema; no cyanosis SKIN: Normal color for age and race; warm; no rash on exposed skin NEURO: Moves all extremities equally PSYCH: The patient's mood and manner are appropriate.  ____________________________________________   LABS (all labs ordered are listed, but only abnormal results are displayed)  Labs Reviewed   CBC - Abnormal; Notable for the following components:      Result Value   Hemoglobin 9.1 (*)    HCT 32.0 (*)    MCV 72.6 (*)    MCH 20.6 (*)    MCHC 28.4 (*)    RDW 18.6 (*)    All other components within normal limits  COMPREHENSIVE METABOLIC PANEL - Abnormal; Notable for the following components:   Glucose, Bld 102 (*)    Calcium 8.8 (*)    All other components within normal limits  URINALYSIS, ROUTINE W REFLEX MICROSCOPIC - Abnormal; Notable for the following components:   Color, Urine YELLOW (*)    APPearance CLEAR (*)    Hgb urine dipstick MODERATE (*)    All other components within normal limits  POC URINE PREG, ED   ____________________________________________  EKG  none ____________________________________________  RADIOLOGY I, Rossy Virag, personally viewed and evaluated these images (plain radiographs) as part of my medical decision making, as well as reviewing the written report by the radiologist.  ED MD interpretation: Fat-containing hernia versus inguinal mass versus enlarged lymph node.  Official radiology report(s): CT ABDOMEN PELVIS W CONTRAST  Result Date: 11/16/2020 CLINICAL DATA:  Right lower quadrant abdominal pain starting on Saturday. Vaginal bleeding and abdominal cramping. EXAM: CT ABDOMEN AND PELVIS WITH CONTRAST TECHNIQUE: Multidetector CT imaging of the abdomen and pelvis was performed using the standard protocol following bolus administration of intravenous contrast. CONTRAST:  141mL OMNIPAQUE IOHEXOL 300 MG/ML  SOLN COMPARISON:  None. FINDINGS: Lower chest: Motion artifact limits examination. Lung bases appear clear. Hepatobiliary: No focal liver abnormality is seen. No gallstones, gallbladder wall thickening, or biliary dilatation. Pancreas: Unremarkable. No pancreatic ductal dilatation or surrounding inflammatory changes. Spleen: Normal in size without focal abnormality. Adrenals/Urinary Tract: Adrenal glands are unremarkable. Kidneys are normal,  without renal calculi, focal lesion, or hydronephrosis. Bladder is unremarkable. Stomach/Bowel: Stomach, small bowel, and colon are not abnormally distended. No wall thickening or inflammatory changes are appreciated. The appendix is normal. Vascular/Lymphatic: Normal caliber abdominal aorta. Scattered retroperitoneal and mesenteric lymph nodes without pathologic enlargement, likely reactive. Reproductive: Nodular enlargement of the uterus consistent with uterine fibroids. Largest measures about 6.4 cm in diameter. Right adnexal cyst measuring 4.4 cm diameter. Other: No free air or free fluid in the abdomen. Moderate supraumbilical anterior abdominal wall hernias containing fat. Heterogeneous mass like soft tissue lesion demonstrated in the right inguinal canal, measuring 3 x 4.6 cm. This could represent a hernia with fat necrosis, enlarged lymph node, or an inguinal mass lesion. No adjacent bowel is identified to suggest bowel herniation. Musculoskeletal: No acute or significant osseous findings. IMPRESSION: 1. Moderate supraumbilical anterior abdominal wall hernias containing fat. 2. Heterogeneous mass like soft tissue lesion in the right inguinal canal, measuring 3 x 4.6 cm. This could represent a hernia with fat necrosis, enlarged lymph node, or an inguinal mass lesion. Surgical consultation suggested. 3. Uterine fibroids. 4. Right adnexal cyst  measuring 4.4 cm diameter. No follow-up imaging recommended. note: This recommendation does not apply to premenarchal patients and to those with increased risk (genetic, family history, elevated tumor markers or other high-risk factors) of ovarian cancer. Reference: JACR 2020 Feb; 17(2):248-254 Electronically Signed   By: Lucienne Capers M.D.   On: 11/16/2020 02:10    ____________________________________________   PROCEDURES  Procedure(s) performed (including Critical Care):  Procedures  ____________________________________________   INITIAL IMPRESSION /  ASSESSMENT AND PLAN / ED COURSE  As part of my medical decision making, I reviewed the following data within the [facility] notes reviewed and incorporated, Labs reviewed , Old chart reviewed, A consult was requested and obtained from this/these consultant(s) Surgery, Notes from prior ED visits and [place] Controlled Substance Database         Patient sent here from urgent care for concerns for an mass in her right lower quadrant.  Patient has a right inguinal hernia on exam that I am not able to reduce.  She has no history of previous hernias.  She does not have any symptoms of a bowel obstruction.  She is well-appearing and declines pain medication.  We will proceed with CT imaging to evaluate if there is any incarcerated bowel in this hernia.  Her labs, urine are unremarkable today.  She does report intermittent vaginal bleeding but states that her menstrual cycles are very irregular and that this is not abnormal for her to have bleeding several times in a month.  No abnormal discharge.  ED PROGRESS  Patient CT scan shows fat-containing inguinal hernia with necrosis versus enlarged lymph node versus inguinal mass lesion.  Discussed with Dr. Celine Ahr on-call for general surgery who has reviewed patient's imaging.  She suspects that this is likely an inguinal fat-containing hernia but agrees with outpatient follow-up.  Will have patient call Dr. Glenford Peers office in the morning to schedule follow-up.  I feel patient is safe to be discharged home.  She is also comfortable with this plan.  Discussed return precautions.  Patient has been intermittently hypertensive here.  She is asymptomatic.  I have recommended close follow-up with a primary care doctor for this.  At this time, I do not feel there is any life-threatening condition present. I have reviewed, interpreted and discussed all results (EKG, imaging, lab, urine as appropriate) and exam findings with patient/family. I have  reviewed nursing notes and appropriate previous records.  I feel the patient is safe to be discharged home without further emergent workup and can continue workup as an outpatient as needed. Discussed usual and customary return precautions. Patient/family verbalize understanding and are comfortable with this plan.  Outpatient follow-up has been provided as needed. All questions have been answered.  ____________________________________________   FINAL CLINICAL IMPRESSION(S) / ED DIAGNOSES  Final diagnoses:  Mass of right inguinal region  Hypertension, unspecified type     ED Discharge Orders    None      *Please note:  Nicole [name] was evaluated in Emergency Department on 11/16/2020 for the symptoms described in the history of present illness. She was evaluated in the context of the global COVID-19 pandemic, which necessitated consideration that the patient might be at risk for infection with the SARS-CoV-2 virus that causes COVID-19. Institutional protocols and algorithms that pertain to the evaluation of patients at risk for COVID-19 are in a state of rapid change based on information released by regulatory bodies including the CDC and federal and state organizations. These policies and  algorithms were followed during the patient's care in the ED.  Some ED evaluations and interventions may be delayed as a result of limited staffing during and the pandemic.*   Note:  This document was prepared using Dragon voice recognition software and may include unintentional dictation errors.   Sarahjane Matherly, Delice Bison, DO 11/16/20 [phone]

## 2020-11-16 NOTE — Discharge Instructions (Addendum)
You were found to have a mass in your right inguinal area.  This is likely from a fat-containing hernia.  We recommend close follow-up with general surgery.  Please call in the morning to schedule an appointment.  Your labs, urine otherwise were unremarkable.  You may alternate Tylenol 1000 mg every 6 hours as needed for pain, fever and Ibuprofen 800 mg every 8 hours as needed for pain, fever.  Please take Ibuprofen with food.  Do not take more than 4000 mg of Tylenol (acetaminophen) in a 24 hour period.  You were also noted to be hypertensive intermittently in the ED.  Recommend close follow-up with a primary care physician for this.  If you develop chest pain, vision changes, severe headache, numbness or weakness, changes in speech, confusion, please return to the ED.  Steps to find a Primary Care Provider (PCP):  Call [phone] or [phone] to access "[facility]."  2.  You may also go on the [facility] website at [email or web address]

## 2020-11-16 NOTE — ED Notes (Signed)
Patient transported to CT 

## 2020-11-19 ENCOUNTER — Encounter: Payer: Self-pay | Admitting: Emergency Medicine

## 2020-11-19 ENCOUNTER — Ambulatory Visit
Admission: EM | Admit: 2020-11-19 | Discharge: 2020-11-19 | Disposition: A | Discharge status: Home / Self Care | Payer: BC Managed Care – PPO

## 2020-11-19 ENCOUNTER — Other Ambulatory Visit: Payer: Self-pay

## 2020-11-19 DIAGNOSIS — Z8742 Personal history of other diseases of the female genital tract: Secondary | ICD-10-CM

## 2020-11-19 NOTE — ED Provider Notes (Signed)
[facility]    CSN: [id number] Arrival date & time: 11/19/20  1550      History   Chief Complaint Chief Complaint  Patient presents with  . Letter for School/Work    HPI Nicole [name] is a 40 y.o. female.   HPI   40 year old female here for follow-up on her CT scan of her abdomen pelvis.  Patient was evaluated here 4 days ago and was sent to the emergency department where she had a CT scan of her abdomen that showed moderate super umbilical abdominal wall hernias containing fat, a heterogenous masslike soft tissue lesion in the right inguinal canal that could represent an hernia with fat necrosis, enlarged lymph node, or inguinal mass, uterine fibroids, and a right adnexal cyst measuring 4.4 cm.  Patient has a follow-up appointment with Dr. Fredirick Maudlin with general surgery on 12/02/2020.  Patient had questions as to why her periods have been so heavy and how long she might be out following surgery.  History reviewed. No pertinent past medical history.  There are no problems to display for this patient.   History reviewed. No pertinent surgical history.  OB History   No obstetric history on file.      Home Medications    Prior to Admission medications   Medication Sig Start Date End Date Taking? Authorizing Provider  pantoprazole (PROTONIX) 40 MG tablet Take 1 tablet (40 mg total) by mouth daily. 11/11/20 11/15/20  Coral Spikes, DO    Family History Family History  Problem Relation Age of Onset  . Healthy Mother     Social History Social History   Tobacco Use  . Smoking status: Current Every Day Smoker    Packs/day: 0.50    Types: Cigars  . Smokeless tobacco: Never Used  Vaping Use  . Vaping Use: Never used  Substance Use Topics  . Alcohol use: Yes  . Drug use: Never     Allergies   Hornet venom   Review of Systems Review of Systems  Constitutional: Negative for fever.  Gastrointestinal: Positive for abdominal pain. Negative for  nausea and vomiting.  Genitourinary: Positive for vaginal bleeding.  Skin: Negative for rash.  Hematological: Negative.   Psychiatric/Behavioral: Negative.      Physical Exam Triage Vital Signs ED Triage Vitals  Enc Vitals Group     BP 11/19/20 1629 (!) 111/97     Pulse Rate 11/19/20 1629 72     Resp 11/19/20 1629 14     Temp 11/19/20 1629 99 F (37.2 C)     Temp Source 11/19/20 1629 Oral     SpO2 11/19/20 1629 97 %     Weight 11/19/20 1626 205 lb 0.4 oz (93 kg)     Height 11/19/20 1626 5\' 6"  (1.676 m)     Head Circumference --      Peak Flow --      Pain Score 11/19/20 1626 4     Pain Loc --      Pain Edu? --      Excl. in [place]? --    No data found.  Updated Vital Signs BP (!) 111/97 (BP Location: Left Arm)   Pulse 72   Temp 99 F (37.2 C) (Oral)   Resp 14   Ht 5\' 6"  (1.676 m)   Wt 205 lb 0.4 oz (93 kg)   LMP 11/02/2020 (Approximate)   SpO2 97%   BMI 33.09 kg/m   Visual Acuity Right Eye Distance:  Left Eye Distance:   Bilateral Distance:    Right Eye Near:   Left Eye Near:    Bilateral Near:     Physical Exam Vitals and nursing note reviewed.  Constitutional:      General: She is not in acute distress.    Appearance: Normal appearance.  HENT:     Head: Normocephalic and atraumatic.  Skin:    General: Skin is warm and dry.     Capillary Refill: Capillary refill takes less than 2 seconds.     Findings: No erythema or rash.  Neurological:     General: No focal deficit present.     Mental Status: She is alert and oriented to person, place, and time.  Psychiatric:        Mood and Affect: Mood normal.        Behavior: Behavior normal.        Thought Content: Thought content normal.        Judgment: Judgment normal.      UC Treatments / Results  Labs (all labs ordered are listed, but only abnormal results are displayed) Labs Reviewed - No data to display  EKG   Radiology No results found.  Procedures Procedures (including critical care  time)  Medications Ordered in UC Medications - No data to display  Initial Impression / Assessment and Plan / UC Course  I have reviewed the triage vital signs and the nursing notes.  Pertinent labs & imaging results that were available during my care of the patient were reviewed by me and considered in my medical decision making (see chart for details).   Patient is here for clarification of findings from her CT from 4 days ago.  Patient advised that she needs to follow-up with surgery as previously scheduled in order to determine the course of action for surgery, after surgery, and how long she may be out for recovery.  Patient also advised that her heavy menstrual cycles are most likely due to her uterine fibroids and that she needs to follow-up with her gynecologist for definitive treatment in those regards.  Patient is in no acute distress at this time and states that she feels comfortable with the responses she was given today.  Work note provided.   Final Clinical Impressions(s) / UC Diagnoses   Final diagnoses:  H/O menorrhagia     Discharge Instructions     As we discussed, you need to discuss your heavy menstrual cycle and your uterine fibroids with your gynecologist.  Keep your appointment with general surgery on the 21[facility] Seg[address] discussed with them the plan for surgery and how long anticipate you will be out to recover.      ED Prescriptions    None     PDMP not reviewed this encounter.   Margarette Canada, NP 11/19/20 1705

## 2020-11-19 NOTE — ED Triage Notes (Addendum)
Patient has no complaints today.  Patient just had a couple of questions to ask the provider today and needs a work note for today.   Patient has her surgical appointment on 12/03/20.

## 2020-11-19 NOTE — Discharge Instructions (Addendum)
As we discussed, you need to discuss your heavy menstrual cycle and your uterine fibroids with your gynecologist.  Keep your appointment with general surgery on the 21st Seg[facility] discussed with them the plan for surgery and how long anticipate you will be out to recover.

## 2020-12-02 ENCOUNTER — Encounter: Payer: Self-pay | Admitting: General Surgery

## 2020-12-02 ENCOUNTER — Other Ambulatory Visit: Payer: Self-pay

## 2020-12-02 ENCOUNTER — Ambulatory Visit (INDEPENDENT_AMBULATORY_CARE_PROVIDER_SITE_OTHER): Payer: BC Managed Care – PPO | Admitting: General Surgery

## 2020-12-02 VITALS — BP 151/91 | HR 79 | Temp 97.9°F | Ht 66.0 in | Wt 193.4 lb

## 2020-12-02 DIAGNOSIS — R1909 Other intra-abdominal and pelvic swelling, mass and lump: Secondary | ICD-10-CM

## 2020-12-02 NOTE — Patient Instructions (Addendum)
A CT has been schedule for Dec 14, 2020 @ 1 pm (arrive by 12:45) at the [facility] in [place]. Nothing to eat or drink 4 hours prior and pick up contrast between now and the day before the scan.

## 2020-12-02 NOTE — Progress Notes (Signed)
Patient ID: Nicole [name], female   DOB: [birth year], 40 y.o.   MRN: [id number]  Chief Complaint  Patient presents with  . New Patient (Initial Visit)    Right inguinal hernia    HPI Nicole [name] is a 40 y.o. female.   She is here today as follow-up from an emergency room visit on November 15, 2020.  She had presented to the urgent care in [facility] with complaints of abdominal pain and vaginal bleeding.  I have copied the history of present illness from the urgent care provider here:  "HPI   40 year old female here for evaluation of abdominal pain and vaginal bleeding.  Patient reports that she is having pain and has a tender swollen area in her right lower quadrant of her abdomen that started 4 days ago.  Patient reports that she has also had some vaginal bleeding.  Patient reports that she had a normal menses on 11/02/2020 that lasted 7 days.  Her menses had finished for 2 days and then the painful swelling developed in her right lower quadrant and she started having vaginal bleeding again.  Patient denies fever, urinary complaints, or vaginal discharge.  Patient states she has a history of ovarian cysts and is concerned that this may be a cyst.  Patient also has a history of uterine fibroids which give her heavy menstrual cycles."  A CT scan of the abdomen and pelvis was ordered, but due to the time of day of the visit, she had to present to [facility] for the study and was seen in the emergency department.  The ED provider reviewed the CT scan and contacted me, as I was on call that evening.  The notes from the emergency department physician are partially copied here:  "HPI Nicole [name] is a 40 y.o. female with no significant past medical history who presents to the emergency department with a mass in the right lower quadrant that she has noticed for the past 4 days.  Was seen today by nurse practitioner urgent care was sent here for CT of the abdomen pelvis.  She has also had some intermittent  vaginal bleeding but states that she has very irregular menstrual cycles and this is not abnormal for her.  No fevers, nausea, vomiting, diarrhea, dysuria, hematuria, vaginal discharge.  States this area is slightly tender to palpation but she denies any pain when this area is not being palpated.  She reports normal bowel movements."  "ED PROGRESS  Patient CT scan shows fat-containing inguinal hernia with necrosis versus enlarged lymph node versus inguinal mass lesion.  Discussed with Dr. Celine Ahr on-call for general surgery who has reviewed patient's imaging.  She suspects that this is likely an inguinal fat-containing hernia but agrees with outpatient follow-up.  Will have patient call Dr. Glenford Peers office in the morning to schedule follow-up.  I feel patient is safe to be discharged home.  She is also comfortable with this plan."  Since this visit to the emergency room, Nicole [name] states that the mass has disappeared.  She gives further history stating that it has occurred with her past 2 menstrual cycles and always coincides with the timing of her period.  She states the discomfort that she feels when it is present is similar to menstrual cramping.  No associated nausea or vomiting.  No fevers or chills.  Normal bowel function.   Past Medical History:  Diagnosis Date  . Fibroid, uterine   . Inguinal hernia   . Menorrhagia   .  Ovarian cyst     History reviewed. No pertinent surgical history.  Family History  Problem Relation Age of Onset  . Healthy Mother     Social History Social History   Tobacco Use  . Smoking status: Current Every Day Smoker    Packs/day: 0.50    Types: Cigars  . Smokeless tobacco: Never Used  Vaping Use  . Vaping Use: Never used  Substance Use Topics  . Alcohol use: Yes  . Drug use: Never    Allergies  Allergen Reactions  . Hornet Venom Swelling    No current outpatient medications on file.   No current facility-administered medications for this  visit.    Review of Systems Review of Systems  Constitutional: Positive for fatigue.  All other systems reviewed and are negative. Or as discussed in the history of present illness.  Blood pressure (!) 151/91, pulse 79, temperature 97.9 F (36.6 C), temperature source Oral, height 5\' 6"  (1.676 m), weight 193 lb 6.4 oz (87.7 kg), last menstrual period 11/22/2020, SpO2 99 %. Body mass index is 31.22 kg/m.  Physical Exam Physical Exam Constitutional:      General: She is not in acute distress.    Appearance: She is obese.  HENT:     Head: Normocephalic and atraumatic.     Nose:     Comments: Covered with a mask    Mouth/Throat:     Comments: Covered with a mask Eyes:     General: No scleral icterus.       Right eye: No discharge.        Left eye: No discharge.  Neck:     Comments: No palpable cervical or supraclavicular lymphadenopathy.  The trachea is midline.  No thyromegaly or dominant thyroid masses appreciated.  The gland moves freely with deglutition. Cardiovascular:     Rate and Rhythm: Normal rate and regular rhythm.     Pulses: Normal pulses.  Pulmonary:     Effort: Pulmonary effort is normal.     Breath sounds: Normal breath sounds.  Abdominal:     Palpations: Abdomen is soft.     Hernia: A hernia is present.     Comments: Soft, reducible epigastric hernia.  Nonpainful.  There is no inguinal hernia appreciated on exam today.  No palpable mass in the inguinal or femoral canal.  Genitourinary:    Comments: Deferred Musculoskeletal:        General: No swelling or tenderness.     Right lower leg: No edema.     Left lower leg: No edema.  Skin:    General: Skin is warm and dry.  Neurological:     General: No focal deficit present.     Mental Status: She is alert.  Psychiatric:        Mood and Affect: Mood normal.        Behavior: Behavior normal.     Data Reviewed I personally reviewed the imaging obtained during her emergency department evaluation, as  well as the documentation of both the urgent care and emergency department providers as copied in my history of present illness.        CLINICAL DATA:  Right lower quadrant abdominal pain starting on Saturday. Vaginal bleeding and abdominal cramping.  EXAM: CT ABDOMEN AND PELVIS WITH CONTRAST  TECHNIQUE: Multidetector CT imaging of the abdomen and pelvis was performed using the standard protocol following bolus administration of intravenous contrast.  CONTRAST:  148mL OMNIPAQUE IOHEXOL 300 MG/ML  SOLN  COMPARISON:  None.  FINDINGS: Lower chest: Motion artifact limits examination. Lung bases appear clear.  Hepatobiliary: No focal liver abnormality is seen. No gallstones, gallbladder wall thickening, or biliary dilatation.  Pancreas: Unremarkable. No pancreatic ductal dilatation or surrounding inflammatory changes.  Spleen: Normal in size without focal abnormality.  Adrenals/Urinary Tract: Adrenal glands are unremarkable. Kidneys are normal, without renal calculi, focal lesion, or hydronephrosis. Bladder is unremarkable.  Stomach/Bowel: Stomach, small bowel, and colon are not abnormally distended. No wall thickening or inflammatory changes are appreciated. The appendix is normal.  Vascular/Lymphatic: Normal caliber abdominal aorta. Scattered retroperitoneal and mesenteric lymph nodes without pathologic enlargement, likely reactive.  Reproductive: Nodular enlargement of the uterus consistent with uterine fibroids. Largest measures about 6.4 cm in diameter. Right adnexal cyst measuring 4.4 cm diameter.  Other: No free air or free fluid in the abdomen. Moderate supraumbilical anterior abdominal wall hernias containing fat. Heterogeneous mass like soft tissue lesion demonstrated in the right inguinal canal, measuring 3 x 4.6 cm. This could represent a hernia with fat necrosis, enlarged lymph node, or an inguinal mass lesion. No adjacent bowel is  identified to suggest bowel herniation.  Musculoskeletal: No acute or significant osseous findings.  IMPRESSION: 1. Moderate supraumbilical anterior abdominal wall hernias containing fat. 2. Heterogeneous mass like soft tissue lesion in the right inguinal canal, measuring 3 x 4.6 cm. This could represent a hernia with fat necrosis, enlarged lymph node, or an inguinal mass lesion. Surgical consultation suggested. 3. Uterine fibroids. 4. Right adnexal cyst measuring 4.4 cm diameter. No follow-up imaging recommended. note: This recommendation does not apply to premenarchal patients and to those with increased risk (genetic, family history, elevated tumor markers or other high-risk factors) of ovarian cancer. Reference: JACR 2020 Feb; 17(2):248-254  At the time but I was consulted by the emergency room provider, I felt that the imaging most likely represented a fat-containing inguinal hernia.  I do concur with the possibility of alternate diagnoses as discussed by the radiologist, above.  Assessment This is a 40 year old woman who was seen in the urgent care for right lower quadrant pain and heavy uterine bleeding.  Imaging was performed that demonstrated an asymptomatic epigastric hernia as well as a right inguinal mass.  On today's evaluation, the mass is not present.  The patient states that it went away after completing her menstrual cycle and has occurred previously with the same timing and results.  Although I initially believed this was a hernia, I am now less certain of this and am considering alternate possibilities, including endometriosis or lymphadenopathy.  Plan I discussed the possibility of reimaging to see if the area is still present, versus expectant management.  Ms. Washburn indicated that she would prefer to have the scan performed.  We will arrange this and I will communicate with her regarding the results.  If a hernia is indeed present, she would like to have it repaired.   We will await the results of the imaging and make further plans from there.    Fredirick Maudlin 12/02/2020, 2:54 PM

## 2020-12-07 ENCOUNTER — Ambulatory Visit: Payer: Self-pay | Admitting: General Surgery

## 2020-12-10 ENCOUNTER — Telehealth: Payer: Self-pay

## 2020-12-10 NOTE — Telephone Encounter (Signed)
I LVM for pt to pick contrast for her CT scan scheduled for 12/14/2020.

## 2020-12-14 ENCOUNTER — Ambulatory Visit: Admission: RE | Admit: 2020-12-14 | Payer: BC Managed Care – PPO | Source: Ambulatory Visit

## 2020-12-16 ENCOUNTER — Ambulatory Visit (HOSPITAL_COMMUNITY): Payer: BC Managed Care – PPO

## 2021-01-05 ENCOUNTER — Other Ambulatory Visit: Payer: Self-pay

## 2021-01-05 ENCOUNTER — Ambulatory Visit
Admission: EM | Admit: 2021-01-05 | Discharge: 2021-01-05 | Disposition: A | Discharge status: Home / Self Care | Payer: BC Managed Care – PPO | Attending: Sports Medicine | Admitting: Sports Medicine

## 2021-01-05 DIAGNOSIS — R42 Dizziness and giddiness: Secondary | ICD-10-CM

## 2021-01-05 NOTE — Discharge Instructions (Signed)
As we discussed, your blood pressure is 126/93.  It is not low and it is not high. I have given you educational handout on dizziness. Please make sure that you are keeping up on your fluids.  You are peeing clear.  Water and no coffee, teas, sodas. You can also let your primary care provider know that you were dizzy and seen in the urgent care and your exam was normal and your blood pressure was also normal. If symptoms persist or worsen please go to the emergency room. At your request I provided you a work note saying you were seen today.

## 2021-01-05 NOTE — ED Provider Notes (Signed)
[facility]    CSN: [id number] Arrival date & time: 01/05/21  1537      History   Chief Complaint Chief Complaint  Patient presents with  . Dizziness    HPI Nicole LOSEE is a 40 y.o. female.   Patient is a 40 year old female who presents for evaluation of the above issue.  Normally sees family care in [place] but could not get an appointment today.  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  She reports yesterday she felt a little bit dizzy.  It was only one episode.  The room was not spinning nor she when she was spinning.  She said that she has been keeping up on her fluid intake.  She says "I drink a lot of water".  She is concerned that her blood pressure is elevated as she is on blood pressure medication.  The only medicine she takes.  This morning she was a little bit lightheaded but nothing like yesterday.  She denies any fever shakes chills, no nausea vomiting or diarrhea.  No chest pain or shortness of breath.  No red flag signs or symptoms were elicited on history.     Past Medical History:  Diagnosis Date  . Fibroid, uterine   . Inguinal hernia   . Menorrhagia   . Ovarian cyst     There are no problems to display for this patient.   History reviewed. No pertinent surgical history.  OB History   No obstetric history on file.      Home Medications    Prior to Admission medications   Medication Sig Start Date End Date Taking? Authorizing Provider  irbesartan (AVAPRO) 150 MG tablet Take 150 mg by mouth daily. 08/18/20   [provider]  pantoprazole (PROTONIX) 40 MG tablet Take 1 tablet (40 mg total) by mouth daily. 11/11/20 11/15/20  Coral Spikes, DO    Family History Family History  Problem Relation Age of Onset  . Healthy Mother     Social History Social History   Tobacco Use  . Smoking status: Current Every Day Smoker    Packs/day: 0.50    Types: Cigars  . Smokeless tobacco: Never Used  Vaping Use  .  Vaping Use: Never used  Substance Use Topics  . Alcohol use: Yes  . Drug use: Never     Allergies   Hornet venom   Review of Systems Review of Systems  Constitutional: Negative for appetite change, chills, diaphoresis, fatigue and fever.  HENT: Negative for congestion, ear pain, postnasal drip, rhinorrhea, sinus pressure, sinus pain, sneezing and sore throat.   Eyes: Negative for pain.  Respiratory: Negative for cough, chest tightness and shortness of breath.   Cardiovascular: Negative for chest pain and palpitations.  Gastrointestinal: Negative for abdominal pain, diarrhea, nausea and vomiting.  Genitourinary: Negative for dysuria.  Musculoskeletal: Negative for back pain, myalgias and neck pain.  Skin: Negative for color change, pallor, rash and wound.  Neurological: Positive for dizziness and light-headedness. Negative for seizures, syncope, weakness, numbness and headaches.  All other systems reviewed and are negative.    Physical Exam Triage Vital Signs ED Triage Vitals  Enc Vitals Group     BP 01/05/21 1602 (!) 126/93     Pulse Rate 01/05/21 1602 79     Resp 01/05/21 1602 17     Temp 01/05/21 1602 98.4 F (36.9 C)     Temp Source 01/05/21 1602 Oral  SpO2 01/05/21 1602 100 %     Weight 01/05/21 1601 191 lb 12.8 oz (87 kg)     Height 01/05/21 1601 5\' 6"  (1.676 m)     Head Circumference --      Peak Flow --      Pain Score 01/05/21 1601 0     Pain Loc --      Pain Edu? --      Excl. in [place]? --    No data found.  Updated Vital Signs BP (!) 126/93 (BP Location: Right Arm)   Pulse 79   Temp 98.4 F (36.9 C) (Oral)   Resp 17   Ht 5\' 6"  (1.676 m)   Wt 87 kg   LMP 12/18/2020   SpO2 100%   BMI 30.96 kg/m   Visual Acuity Right Eye Distance:   Left Eye Distance:   Bilateral Distance:    Right Eye Near:   Left Eye Near:    Bilateral Near:     Physical Exam Vitals and nursing note reviewed.  Constitutional:      General: She is not in acute  distress.    Appearance: Normal appearance. She is not ill-appearing, toxic-appearing or diaphoretic.  HENT:     Head: Normocephalic and atraumatic.     Nose: Nose normal.     Mouth/Throat:     Mouth: Mucous membranes are moist.  Eyes:     General: No scleral icterus.       Right eye: No discharge.        Left eye: No discharge.     Extraocular Movements: Extraocular movements intact.     Conjunctiva/sclera: Conjunctivae normal.     Pupils: Pupils are equal, round, and reactive to light.  Cardiovascular:     Rate and Rhythm: Normal rate and regular rhythm.     Pulses: Normal pulses.     Heart sounds: Normal heart sounds. No murmur heard. No friction rub. No gallop.   Pulmonary:     Effort: Pulmonary effort is normal.     Breath sounds: Normal breath sounds. No stridor. No wheezing, rhonchi or rales.  Musculoskeletal:     Cervical back: Normal range of motion and neck supple. No rigidity.  Skin:    General: Skin is warm and dry.     Capillary Refill: Capillary refill takes less than 2 seconds.     Coloration: Skin is not jaundiced.     Findings: No bruising, erythema, lesion or rash.  Neurological:     General: No focal deficit present.     Mental Status: She is alert and oriented to person, place, and time.     Cranial Nerves: No cranial nerve deficit.     Sensory: No sensory deficit.     Motor: No weakness.     Coordination: Coordination normal.     Gait: Gait normal.     Deep Tendon Reflexes: Reflexes normal.  Psychiatric:        Mood and Affect: Mood normal.      UC Treatments / Results  Labs (all labs ordered are listed, but only abnormal results are displayed) Labs Reviewed - No data to display  EKG   Radiology No results found.  Procedures Procedures (including critical care time)  Medications Ordered in UC Medications - No data to display  Initial Impression / Assessment and Plan / UC Course  I have reviewed the triage vital signs and the nursing  notes.  Pertinent labs & imaging results that were  available during my care of the patient were reviewed by me and considered in my medical decision making (see chart for details).  Clinical impression: 1 episode of dizziness yesterday and a little lightheaded today.  Neurological examination as well as the entire physical examination are reassuring.  Blood pressure is normal.  Treatment plan: 1.  The findings and treatment plan were discussed in detail with the patient.  Patient was in agreement. 2.  I reassured her that her blood pressure was nothing to be concerned about.  Continue to take her blood pressure meds as prescribed. 3.  Advised her to let her primary care physician's office know of her symptoms and that she was seen in the urgent care and what her number was. 4.  Educational handouts provided. 5.  Plenty of rest, plenty of fluids.  Stay away from coffee teas and sodas.  Water preferred. 6.  Indicated that if her symptoms were to come back or worsen she should go to the ER. 7.  She requested a work note and at her request I went ahead and gave her [place] she was seen today. 8.  She was discharged in stable condition and she will follow-up here as needed.    Final Clinical Impressions(s) / UC Diagnoses   Final diagnoses:  Dizziness     Discharge Instructions     As we discussed, your blood pressure is 126/93.  It is not low and it is not high. I have given you educational handout on dizziness. Please make sure that you are keeping up on your fluids.  You are peeing clear.  Water and no coffee, teas, sodas. You can also let your primary care provider know that you were dizzy and seen in the urgent care and your exam was normal and your blood pressure was also normal. If symptoms persist or worsen please go to the emergency room. At your request I provided you a work note saying you were seen today.    ED Prescriptions    None     PDMP not reviewed this  encounter.   Verda Cumins, MD 01/05/21 [phone]

## 2021-01-05 NOTE — ED Triage Notes (Signed)
States that she has been having dizziness since yesterday and is concerned her BP may be elevated.

## 2021-02-16 ENCOUNTER — Other Ambulatory Visit: Payer: Self-pay

## 2021-02-16 ENCOUNTER — Ambulatory Visit
Admission: EM | Admit: 2021-02-16 | Discharge: 2021-02-16 | Disposition: A | Discharge status: Home / Self Care | Payer: BC Managed Care – PPO | Attending: Sports Medicine | Admitting: Sports Medicine

## 2021-02-16 DIAGNOSIS — R42 Dizziness and giddiness: Secondary | ICD-10-CM

## 2021-02-16 MED ORDER — MECLIZINE HCL 25 MG PO TABS: 25.0000 mg | ORAL_TABLET | Freq: Three times a day (TID) | ORAL | 0 refills | Status: DC | PRN

## 2021-02-16 NOTE — ED Triage Notes (Signed)
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

## 2021-02-16 NOTE — Discharge Instructions (Addendum)
As we discussed, I am treating you for vertigo and dizziness. I sent in a prescription to your pharmacy for vertigo.  Just use it as needed. Please hydrate aggressively with lots of water, Pedialyte, or Gatorade. Please see educational handouts. I provided a work note keep you out of work tomorrow and go back on Friday. If your symptoms persist please see your primary care provider. If they worsen then please go to the ER.

## 2021-02-17 NOTE — ED Provider Notes (Signed)
[facility]    CSN: [id number] Arrival date & time: 02/16/21  1920      History   Chief Complaint Chief Complaint  Patient presents with   Dizziness    HPI Nicole [name] is a 40 y.o. female.   Patient is a 40 year old female who presents for evaluation of some dizziness that began today.  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  She works at the [employer] and there is no air conditioning.  She thinks she may have gotten behind on her fluids and was a little bit dehydrated.  She said the room was spinning and it felt like she had a little bit of vertigo.  She is scheduled to go back into work and she does not feel as though she can go back in feeling the way she does.  She denies any headache or vision changes.  No URI symptoms.  No sore throat congestion cough chest pain or shortness of breath.  No abdominal or urinary symptoms.  No red flag signs or symptoms elicited on history.   Past Medical History:  Diagnosis Date   Fibroid, uterine    Inguinal hernia    Menorrhagia    Ovarian cyst     There are no problems to display for this patient.   History reviewed. No pertinent surgical history.  OB History   No obstetric history on file.      Home Medications    Prior to Admission medications   Medication Sig Start Date End Date Taking? Authorizing Provider  irbesartan (AVAPRO) 150 MG tablet Take 150 mg by mouth daily. 08/18/20  Yes [provider]  meclizine (ANTIVERT) 25 MG tablet Take 1 tablet (25 mg total) by mouth 3 (three) times daily as needed for dizziness. 02/16/21  Yes Verda Cumins, MD  pantoprazole (PROTONIX) 40 MG tablet Take 1 tablet (40 mg total) by mouth daily. 11/11/20 11/15/20  Coral Spikes, DO    Family History Family History  Problem Relation Age of Onset   Healthy Mother     Social History Social History   Tobacco Use   Smoking status: Every Day    Packs/day: 0.50    Pack years:  0.00    Types: Cigars, Cigarettes   Smokeless tobacco: Never  Vaping Use   Vaping Use: Never used  Substance Use Topics   Alcohol use: Yes   Drug use: Never     Allergies   Hornet venom   Review of Systems Review of Systems  Constitutional:  Negative for chills, diaphoresis, fatigue and fever.  HENT:  Negative for congestion, ear pain, postnasal drip, rhinorrhea, sinus pressure, sinus pain, sneezing and sore throat.   Eyes:  Negative for pain.  Respiratory:  Negative for cough, chest tightness and shortness of breath.   Cardiovascular:  Negative for chest pain and palpitations.  Gastrointestinal:  Negative for abdominal pain, diarrhea, nausea and vomiting.  Genitourinary:  Negative for dysuria.  Musculoskeletal:  Negative for back pain, myalgias and neck pain.  Skin:  Negative for color change, pallor, rash and wound.  Neurological:  Positive for dizziness and light-headedness. Negative for seizures, weakness, numbness and headaches.  All other systems reviewed and are negative.   Physical Exam Triage Vital Signs ED Triage Vitals  Enc Vitals Group     BP 02/16/21 1932 (!) 151/99     Pulse Rate 02/16/21 1932 85     Resp 02/16/21 1932 17  Temp 02/16/21 1932 99.2 F (37.3 C)     Temp Source 02/16/21 1932 Oral     SpO2 02/16/21 1932 100 %     Weight 02/16/21 1930 191 lb 12.8 oz (87 kg)     Height 02/16/21 1930 5\' 6"  (1.676 m)     Head Circumference --      Peak Flow --      Pain Score 02/16/21 1929 0     Pain Loc --      Pain Edu? --      Excl. in [place]? --    No data found.  Updated Vital Signs BP (!) 151/99 (BP Location: Left Arm)   Pulse 85   Temp 99.2 F (37.3 C) (Oral)   Resp 17   Ht 5\' 6"  (1.676 m)   Wt 87 kg   LMP 02/09/2021   SpO2 100%   BMI 30.96 kg/m   Visual Acuity Right Eye Distance:   Left Eye Distance:   Bilateral Distance:    Right Eye Near:   Left Eye Near:    Bilateral Near:     Physical Exam Constitutional:      General: She is  not in acute distress.    Appearance: Normal appearance. She is not ill-appearing, toxic-appearing or diaphoretic.  HENT:     Head: Normocephalic and atraumatic.     Nose: Nose normal.     Mouth/Throat:     Mouth: Mucous membranes are moist.     Pharynx: No oropharyngeal exudate or posterior oropharyngeal erythema.  Eyes:     General: No scleral icterus.       Right eye: No discharge.        Left eye: No discharge.     Extraocular Movements: Extraocular movements intact.     Conjunctiva/sclera: Conjunctivae normal.     Pupils: Pupils are equal, round, and reactive to light.  Cardiovascular:     Rate and Rhythm: Normal rate and regular rhythm.     Pulses: Normal pulses.     Heart sounds: Normal heart sounds. No murmur heard.   No friction rub. No gallop.  Pulmonary:     Effort: Pulmonary effort is normal.     Breath sounds: Normal breath sounds. No stridor. No wheezing, rhonchi or rales.  Musculoskeletal:     Cervical back: Normal range of motion and neck supple. No rigidity or tenderness.  Skin:    General: Skin is warm and dry.     Capillary Refill: Capillary refill takes less than 2 seconds.  Neurological:     General: No focal deficit present.     Mental Status: She is alert and oriented to person, place, and time.     GCS: GCS eye subscore is 4. GCS verbal subscore is 5. GCS motor subscore is 6.     Cranial Nerves: Cranial nerves are intact.     Sensory: Sensation is intact.     Motor: Motor function is intact.     Coordination: Coordination is intact.     Gait: Gait is intact.     Deep Tendon Reflexes: Reflexes are normal and symmetric.     UC Treatments / Results  Labs (all labs ordered are listed, but only abnormal results are displayed) Labs Reviewed - No data to display  EKG   Radiology No results found.  Procedures Procedures (including critical care time)  Medications Ordered in UC Medications - No data to display  Initial Impression / Assessment  and Plan / UC  Course  I have reviewed the triage vital signs and the nursing notes.  Pertinent labs & imaging results that were available during my care of the patient were reviewed by me and considered in my medical decision making (see chart for details).  Clinical impression: Dizziness and vertigo since this morning.  May be a component of dehydration but her mucous membranes are moist and her vital signs are stable.  She is not hypotensive.  She has no URI symptoms.  Her neurological exam is within normal limits.  Treatment plan: 1.  The findings and treatment plan were discussed in detail with the patient.  Patient was in agreement. 2.  I went ahead and treated her for vertigo and dizziness.  Sent in a prescription for meclizine.  She can use that as needed. 3.  I want her to hydrate aggressively with lots of water, Pedialyte, Gatorade or Powerade. 4.  Educational handouts provided. 5.  Did give her a work note keep her out of work for 1 shift and she go back on Friday. 6.  If her symptoms persist she needs to see her primary care physician. 7.  If they worsen she should go to the ER. 8.  She was stable on discharge and will follow-up here as needed.    Final Clinical Impressions(s) / UC Diagnoses   Final diagnoses:  Vertigo  Dizziness     Discharge Instructions      As we discussed, I am treating you for vertigo and dizziness. I sent in a prescription to your pharmacy for vertigo.  Just use it as needed. Please hydrate aggressively with lots of water, Pedialyte, or Gatorade. Please see educational handouts. I provided a work note keep you out of work tomorrow and go back on Friday. If your symptoms persist please see your primary care provider. If they worsen then please go to the ER.     ED Prescriptions     Medication Sig Dispense Auth. Provider   meclizine (ANTIVERT) 25 MG tablet Take 1 tablet (25 mg total) by mouth 3 (three) times daily as needed for dizziness.  30 tablet Verda Cumins, MD      PDMP not reviewed this encounter.   Verda Cumins, MD 02/17/21 2105

## 2021-03-03 ENCOUNTER — Ambulatory Visit
Admission: EM | Admit: 2021-03-03 | Discharge: 2021-03-03 | Disposition: A | Discharge status: Home / Self Care | Payer: BC Managed Care – PPO | Attending: Family Medicine | Admitting: Family Medicine

## 2021-03-03 ENCOUNTER — Other Ambulatory Visit: Payer: Self-pay

## 2021-03-03 DIAGNOSIS — D259 Leiomyoma of uterus, unspecified: Secondary | ICD-10-CM | POA: Diagnosis not present

## 2021-03-03 MED ORDER — IBUPROFEN 800 MG PO TABS: 800.0000 mg | ORAL_TABLET | Freq: Three times a day (TID) | ORAL | 1 refills | Status: DC | PRN

## 2021-03-03 NOTE — ED Triage Notes (Signed)
Period started, has fibroids that makes periods worst, hasn't been in work, needs a note. Has appt with surgery on Aug 5th to set surgery date

## 2021-03-07 NOTE — ED Provider Notes (Signed)
[facility]    CSN: [id number] Arrival date & time: 03/03/21  1805      History   Chief Complaint Chief Complaint  Patient presents with   Menstrual Problem    HPI 40 year old female presents with the above complaint.  Patient has known fibroids.  Patient states that she has been having dysmenorrhea.  She states that her pain is now improved.  However, she has missed work and needs a note for work.  Pain currently 4/10 in severity.  She has an upcoming appointment with OB/GYN to discuss uterine fibroids and possible surgery.  No other reported symptoms.  No other complaints.  Past Medical History:  Diagnosis Date   Fibroid, uterine    Inguinal hernia    Menorrhagia    Ovarian cyst    Home Medications    Prior to Admission medications   Medication Sig Start Date End Date Taking? Authorizing Provider  ibuprofen (ADVIL) 800 MG tablet Take 1 tablet (800 mg total) by mouth 3 (three) times daily as needed for moderate pain, cramping or mild pain. 03/03/21  Yes Khalil Szczepanik G, DO  irbesartan (AVAPRO) 150 MG tablet Take 150 mg by mouth daily. 08/18/20   [provider]  meclizine (ANTIVERT) 25 MG tablet Take 1 tablet (25 mg total) by mouth 3 (three) times daily as needed for dizziness. 02/16/21   Verda Cumins, MD  pantoprazole (PROTONIX) 40 MG tablet Take 1 tablet (40 mg total) by mouth daily. 11/11/20 11/15/20  Coral Spikes, DO    Family History Family History  Problem Relation Age of Onset   Healthy Mother     Social History Social History   Tobacco Use   Smoking status: Every Day    Packs/day: 0.50    Types: Cigars, Cigarettes   Smokeless tobacco: Never  Vaping Use   Vaping Use: Never used  Substance Use Topics   Alcohol use: Yes   Drug use: Never     Allergies   Hornet venom   Review of Systems Review of Systems  Constitutional: Negative.   Genitourinary:  Positive for menstrual problem.   Physical Exam Triage Vital Signs ED Triage  Vitals  Enc Vitals Group     BP 03/03/21 1828 (!) 134/98     Pulse Rate 03/03/21 1828 78     Resp 03/03/21 1828 16     Temp 03/03/21 1828 99.4 F (37.4 C)     Temp Source 03/03/21 1828 Oral     SpO2 03/03/21 1828 99 %     Weight 03/03/21 1825 190 lb (86.2 kg)     Height 03/03/21 1825 '5\' 6"'$  (1.676 m)     Head Circumference --      Peak Flow --      Pain Score 03/03/21 1825 4     Pain Loc --      Pain Edu? --      Excl. in [place]? --    Updated Vital Signs BP (!) 134/98 (BP Location: Left Arm)   Pulse 78   Temp 99.4 F (37.4 C) (Oral)   Resp 16   Ht '5\' 6"'$  (1.676 m)   Wt 86.2 kg   LMP 02/27/2021   SpO2 99%   BMI 30.67 kg/m   Visual Acuity Right Eye Distance:   Left Eye Distance:   Bilateral Distance:    Right Eye Near:   Left Eye Near:    Bilateral Near:     Physical Exam Vitals and nursing note  reviewed.  Constitutional:      General: She is not in acute distress.    Appearance: Normal appearance. She is not ill-appearing.  HENT:     Head: Normocephalic and atraumatic.  Eyes:     General:        Right eye: No discharge.        Left eye: No discharge.     Conjunctiva/sclera: Conjunctivae normal.  Cardiovascular:     Rate and Rhythm: Normal rate and regular rhythm.     Heart sounds: No murmur heard. Pulmonary:     Effort: Pulmonary effort is normal. No respiratory distress.     Breath sounds: Normal breath sounds.  Neurological:     Mental Status: She is alert.  Psychiatric:        Mood and Affect: Mood normal.        Behavior: Behavior normal.     UC Treatments / Results  Labs (all labs ordered are listed, but only abnormal results are displayed) Labs Reviewed - No data to display  EKG   Radiology No results found.  Procedures Procedures (including critical care time)  Medications Ordered in UC Medications - No data to display  Initial Impression / Assessment and Plan / UC Course  I have reviewed the triage vital signs and the nursing  notes.  Pertinent labs & imaging results that were available during my care of the patient were reviewed by me and considered in my medical decision making (see chart for details).    40 year old female presents with dysmenorrhea in a setting of uterine fibroids.  Ibuprofen as directed.  Work note given.  Final Clinical Impressions(s) / UC Diagnoses   Final diagnoses:  Uterine leiomyoma, unspecified location   Discharge Instructions   None    ED Prescriptions     Medication Sig Dispense Auth. Provider   ibuprofen (ADVIL) 800 MG tablet Take 1 tablet (800 mg total) by mouth 3 (three) times daily as needed for moderate pain, cramping or mild pain. 30 tablet Coral Spikes, DO      PDMP not reviewed this encounter.   Thersa Salt [place], [place] 03/07/21 [phone]

## 2021-04-13 ENCOUNTER — Other Ambulatory Visit: Payer: Self-pay | Admitting: Obstetrics and Gynecology

## 2021-04-22 ENCOUNTER — Ambulatory Visit
Admission: EM | Admit: 2021-04-22 | Discharge: 2021-04-22 | Disposition: A | Discharge status: Home / Self Care | Payer: BC Managed Care – PPO | Attending: Emergency Medicine | Admitting: Emergency Medicine

## 2021-04-22 ENCOUNTER — Other Ambulatory Visit: Payer: Self-pay

## 2021-04-22 DIAGNOSIS — K0889 Other specified disorders of teeth and supporting structures: Secondary | ICD-10-CM

## 2021-04-22 MED ORDER — AMOXICILLIN-POT CLAVULANATE 875-125 MG PO TABS: 1.0000 | ORAL_TABLET | Freq: Two times a day (BID) | ORAL | 0 refills | Status: AC

## 2021-04-22 NOTE — ED Provider Notes (Signed)
[facility]    CSN: [id number] Arrival date & time: 04/22/21  1913      History   Chief Complaint Chief Complaint  Patient presents with   Dental Pain    HPI SOFIJA PREECE is a 40 y.o. female.   HPI  69 female here for evaluation of dental pain.  Patient reports that she broke a filling last week and she has been having continuous throbbing in the right lower jaw since then.  She denies any drainage, fever, or trouble swallowing.  She states that she has an appointment to see her dentist next week.  Past Medical History:  Diagnosis Date   Fibroid, uterine    Inguinal hernia    Menorrhagia    Ovarian cyst     There are no problems to display for this patient.   History reviewed. No pertinent surgical history.  OB History   No obstetric history on file.      Home Medications    Prior to Admission medications   Medication Sig Start Date End Date Taking? Authorizing Provider  amoxicillin-clavulanate (AUGMENTIN) 875-125 MG tablet Take 1 tablet by mouth every 12 (twelve) hours for 10 days. 04/22/21 05/02/21 Yes Margarette Canada, NP  ibuprofen (ADVIL) 800 MG tablet Take 1 tablet (800 mg total) by mouth 3 (three) times daily as needed for moderate pain, cramping or mild pain. 03/03/21  Yes Cook, Jayce G, DO  irbesartan (AVAPRO) 150 MG tablet Take 150 mg by mouth daily. 08/18/20  Yes [provider]  meclizine (ANTIVERT) 25 MG tablet Take 1 tablet (25 mg total) by mouth 3 (three) times daily as needed for dizziness. 02/16/21  Yes Verda Cumins, MD  pantoprazole (PROTONIX) 40 MG tablet Take 1 tablet (40 mg total) by mouth daily. 11/11/20 11/15/20  Coral Spikes, DO    Family History Family History  Problem Relation Age of Onset   Healthy Mother     Social History Social History   Tobacco Use   Smoking status: Every Day    Packs/day: 0.50    Types: Cigars, Cigarettes   Smokeless tobacco: Never  Vaping Use   Vaping Use: Never used  Substance Use  Topics   Alcohol use: Yes   Drug use: Never     Allergies   Hornet venom   Review of Systems Review of Systems  Constitutional:  Negative for fever.  HENT:  Positive for dental problem. Negative for trouble swallowing.     Physical Exam Triage Vital Signs ED Triage Vitals  Enc Vitals Group     BP 04/22/21 1925 (!) 144/93     Pulse Rate 04/22/21 1925 88     Resp 04/22/21 1925 16     Temp 04/22/21 1925 98.6 F (37 C)     Temp Source 04/22/21 1925 Oral     SpO2 04/22/21 1925 100 %     Weight 04/22/21 1924 190 lb (86.2 kg)     Height 04/22/21 1924 '5\' 6"'$  (1.676 m)     Head Circumference --      Peak Flow --      Pain Score 04/22/21 1924 4     Pain Loc --      Pain Edu? --      Excl. in [place]? --    No data found.  Updated Vital Signs BP (!) 144/93 (BP Location: Right Arm)   Pulse 88   Temp 98.6 F (37 C) (Oral)   Resp 16   Ht  $'5\' 6"'Z$  (1.676 m)   Wt 190 lb (86.2 kg)   LMP 04/20/2021   SpO2 100%   BMI 30.67 kg/m   Visual Acuity Right Eye Distance:   Left Eye Distance:   Bilateral Distance:    Right Eye Near:   Left Eye Near:    Bilateral Near:     Physical Exam Vitals and nursing note reviewed.  Constitutional:      General: She is not in acute distress.    Appearance: Normal appearance. She is normal weight. She is not ill-appearing.  HENT:     Head: Normocephalic and atraumatic.     Mouth/Throat:     Mouth: Mucous membranes are moist.     Pharynx: Oropharynx is clear. No oropharyngeal exudate or posterior oropharyngeal erythema.  Skin:    General: Skin is warm and dry.     Capillary Refill: Capillary refill takes less than 2 seconds.     Findings: No erythema.  Neurological:     General: No focal deficit present.     Mental Status: She is alert and oriented to person, place, and time.  Psychiatric:        Mood and Affect: Mood normal.        Behavior: Behavior normal.        Thought Content: Thought content normal.        Judgment: Judgment  normal.     UC Treatments / Results  Labs (all labs ordered are listed, but only abnormal results are displayed) Labs Reviewed - No data to display  EKG   Radiology No results found.  Procedures Procedures (including critical care time)  Medications Ordered in UC Medications - No data to display  Initial Impression / Assessment and Plan / UC Course  I have reviewed the triage vital signs and the nursing notes.  Pertinent labs & imaging results that were available during my care of the patient were reviewed by me and considered in my medical decision making (see chart for details).  Patient is a nontoxic-appearing 40 year old female here for evaluation of right lower dental pain that started last week as outlined HPI above.  Patient's physical exam reveals a broken filling in the right rear lower molar.  There is no surrounding erythema or edema of the gum tissue.  There is no drainage from the broken filling.  There is no submental fullness or facial swelling.  Patient is not having any difficulty swallowing.  Will cover patient for possible developing dental infection with Augmentin twice daily for 10 days.  Patient to use ibuprofen and Tylenol Corifact instructions needed for pain and follow-up with her dentist next week.   Final Clinical Impressions(s) / UC Diagnoses   Final diagnoses:  Pain, dental     Discharge Instructions      Take the Augmentin twice daily with food for 10 days for treatment of your dental infection.  Use over-the-counter Tylenol and ibuprofen for swelling and mild to moderate pain.  Rinse with warm salt water, or Listerine, after each meal to remove food particles and wash away any pus that is collecting.  If you develop any increasing or swelling, fever, pain, or difficulty swallowing you to go to the emergency department at [facility] with a have an oral surgeon and also a dentist on-call.      ED Prescriptions     Medication Sig  Dispense Auth. Provider   amoxicillin-clavulanate (AUGMENTIN) 875-125 MG tablet Take 1 tablet by mouth every 12 (twelve)  hours for 10 days. 20 tablet Margarette Canada, NP      PDMP not reviewed this encounter.   Margarette Canada, NP 04/22/21 2017

## 2021-04-22 NOTE — ED Triage Notes (Signed)
Patient states that she has been having dental pain on right lower side. States that she broke a filling off this week. States that she has an appt with her dentist scheduled next week.

## 2021-04-22 NOTE — Discharge Instructions (Signed)
Take the Augmentin twice daily with food for 10 days for treatment of your dental infection.  Use over-the-counter Tylenol and ibuprofen for swelling and mild to moderate pain.  Rinse with warm salt water, or Listerine, after each meal to remove food particles and wash away any pus that is collecting.  If you develop any increasing or swelling, fever, pain, or difficulty swallowing you to go to the emergency department at [facility] with a have an oral surgeon and also a dentist on-call.

## 2021-04-27 NOTE — H&P (Signed)
Nicole [name] is a 40 y.o. female here for [facility]  bilateral salpingectomy  .pt has menorrhagia ,fibroids and iron deficient anemia . Pt has a long h/o menses q 3 weeks and bleeds for 5-7 days and + clots . ctscan 11/2020 shows fibroids , one 6 cm  + dysmenorrhea , not sexually active , no prior dyspareunia . Neg recent pap. G1P1 s/p c/s x 1    Pt had bulging into the right inguinal canal 11/2020 that led her to the ED. CtScan showed 4.6x 3 cm fat into the inguinal canal c/w hernia . This subsequently reduced on its own .    IUD placed 10 yrs ago and lost , no eval for uterine perforation and intraabdominal position   Ferritin 4 , mcv 67, Hct 30% U/s : 03/2021:  Uterus anteverted   Endometrium=11.35m   Fluid seen in cervix   Rt ovary volume=46.473mRt simple ovarian cyst=4.2cm Lt simple ovarian cyst=2.2cm No free fluid seen    Past Medical History:  has a past medical history of Fibroid and Hypertension.  Past Surgical History:  has a past surgical history that includes Cesarean section. Family History: family history is not on file. Social History:  reports that she has been smoking. She has never used smokeless tobacco. She reports current alcohol use. OB/GYN History:          OB History     Gravida  1   Para  1   Term      Preterm      AB      Living  1      SAB      IAB      Ectopic      Molar      Multiple      Live Births  1             Allergies: is allergic to bee [venom-yellow jacket]. Medications:   Current Outpatient Medications:    irbesartan (AVAPRO) 150 MG tablet, Take 150 mg by mouth once daily, Disp: , Rfl:    ibuprofen (MOTRIN) 600 MG tablet, 200 mg   (Patient not taking: Reported on 03/18/2021), Disp: , Rfl:    Review of Systems: General:                      No fatigue or weight loss Eyes:                           No vision changes Ears:                            No hearing difficulty Respiratory:                No cough or shortness of  breath Pulmonary:                  No asthma or shortness of breath Cardiovascular:           No chest pain, palpitations, dyspnea on exertion Gastrointestinal:          No abdominal bloating, chronic diarrhea, constipations, masses, pain or hematochezia Genitourinary:             No hematuria, dysuria, abnormal vaginal discharge, pelvic pain, +Menometrorrhagia Lymphatic:                   No swollen lymph nodes  Musculoskeletal:         No muscle weakness Neurologic:                  No extremity weakness, syncope, seizure disorder Psychiatric:                  No history of depression, delusions or suicidal/homicidal ideation      Exam:       Vitals:    03/18/21 1531  BP: 131/83  Pulse: 81      Body mass index is 31.3 kg/m.   WDWN black female in NAD   Lungs: CTA  CV : RRR without murmur   Neck:  no thyromegaly Abdomen: soft , no mass, normal active bowel sounds,  non-tender, no rebound tenderness Pelvic: tanner stage 5 ,  External genitalia: vulva /labia no lesions Urethra: no prolapse Vagina: normal physiologic d/c possible room for LAVH if need be  Cervix: no lesions, no cervical motion tenderness   Uterus: normal size shape and contour, non-tender Adnexa: no mass,  non-tender   Rectovaginal:    Impression:    The primary encounter diagnosis was Menorrhagia with regular cycle. Diagnoses of Intramural leiomyoma of uterus,       Plan:  LAparoscopic supracervical hysterectomy , bilateral salpingectomoy Pt has been counseled regarding the risks of the procedure ( See [place]  notes ) .  She is aware of the risks of upstaging an occult cancer with morcelation . Rsks 1:770-1:10,000 . All questions answered

## 2021-05-02 ENCOUNTER — Inpatient Hospital Stay
Admission: RE | Admit: 2021-05-02 | Discharge: 2021-05-02 | Disposition: A | Discharge status: Home / Self Care | Payer: BC Managed Care – PPO | Source: Ambulatory Visit

## 2021-05-02 NOTE — Pre-Procedure Instructions (Signed)
Unable to reach patient. Called and left messages x 4 on th [phone]. We will re-schedule her Pre-admission testing appt for tomorrow .

## 2021-05-02 NOTE — Patient Instructions (Signed)
Your procedure is scheduled on: 9/29/ 2022 Report to the Registration Desk on the 1st floor of the [facility]. To find out your arrival time, please call [phone] between 1PM - 3PM on: 05/11/2021   REMEMBER: Instructions that are not followed completely may result in serious medical risk, up to and including death; or upon the discretion of your surgeon and anesthesiologist your surgery may need to be rescheduled.  Do not eat food after midnight the night before surgery.  No gum chewing, lozengers or hard candies.  You may however, drink CLEAR liquids up to 2 hours before you are scheduled to arrive for your surgery. Do not drink anything within 2 hours of your scheduled arrival time.  Clear liquids include: - water  - apple juice without pulp - gatorade (not RED, PURPLE, OR BLUE) - black coffee or tea (Do NOT add milk or creamers to the coffee or tea) Do NOT drink anything that is not on this list.    In addition, your doctor has ordered for you to drink the provided  Ensure Pre-Surgery Clear Carbohydrate Drink  Drinking this carbohydrate drink up to two hours before surgery helps to reduce insulin resistance and improve patient outcomes. Please complete drinking 2 hours prior to scheduled arrival time.  TAKE THESE MEDICATIONS THE MORNING OF SURGERY WITH A SIP OF WATER: Meclizine as needed      One week prior to surgery: Stop Anti-inflammatories (NSAIDS) such as Advil, Aleve, Ibuprofen, Motrin, Naproxen, Naprosyn and Aspirin based products such as Excedrin, Goodys Powder, BC Powder. Stop ANY OVER THE COUNTER supplements until after surgery. You may however, continue to take Tylenol if needed for pain up until the day of surgery.  No Alcohol for 24 hours before or after surgery.  No Smoking including e-cigarettes for 24 hours prior to surgery.  No chewable tobacco products for at least 6 hours prior to surgery.  No nicotine patches on the day of surgery.  Do not use  any "recreational" drugs for at least a week prior to your surgery.  Please be advised that the combination of cocaine and anesthesia may have negative outcomes, up to and including death. If you test positive for cocaine, your surgery will be cancelled.  On the morning of surgery brush your teeth with toothpaste and water, you may rinse your mouth with mouthwash if you wish. Do not swallow any toothpaste or mouthwash.  Use CHG Soap or wipes as directed on instruction sheet.-provided for you   Do not wear jewelry, make-up, hairpins, clips or nail polish.  Do not wear lotions, powders, or perfumes.   Do not shave body from the neck down 48 hours prior to surgery just in case you cut yourself which could leave a site for infection.  Also, freshly shaved skin may become irritated if using the CHG soap.  Contact lenses, hearing aids and dentures may not be worn into surgery.  Do not bring valuables to the hospital. [facility] is not responsible for any missing/lost belongings or valuables.     Notify your doctor if there is any change in your medical condition (cold, fever, infection).  Wear comfortable clothing (specific to your surgery type) to the hospital.  After surgery, you can help prevent lung complications by doing breathing exercises.  Take deep breaths and cough every 1-2 hours. Your doctor may order a device called an Incentive Spirometer to help you take deep breaths. When coughing or sneezing, hold a pillow firmly against your incision  with both hands. This is called "splinting." Doing this helps protect your incision. It also decreases belly discomfort.  If you are being admitted to the hospital overnight, leave your suitcase in the car. After surgery it may be brought to your room.  If you are being discharged the day of surgery, you will not be allowed to drive home. You will need a responsible adult (18 years or older) to drive you home and stay with you that night.    If you are taking public transportation, you will need to have a responsible adult (18 years or older) with you. Please confirm with your physician that it is acceptable to use public transportation.   Please call the [facility]. at [phone] if you have any questions about these instructions.  Surgery Visitation Policy:  Patients undergoing a surgery or procedure may have one family member or support person with them as long as that person is not COVID-19 positive or experiencing its symptoms.  That person may remain in the waiting area during the procedure and may rotate out with other people.  Inpatient Visitation:    Visiting hours are 7 a.m. to 8 p.m. Up to two visitors ages 16+ are allowed at one time in a patient room. The visitors may rotate out with other people during the day. Visitors must check out when they leave, or other visitors will not be allowed. One designated support person may remain overnight. The visitor must pass COVID-19 screenings, use hand sanitizer when entering and exiting the patient's room and wear a mask at all times, including in the patient's room. Patients must also wear a mask when staff or their visitor are in the room. Masking is required regardless of vaccination status.

## 2021-05-03 ENCOUNTER — Ambulatory Visit
Admission: RE | Admit: 2021-05-03 | Discharge: 2021-05-03 | Disposition: A | Discharge status: Home / Self Care | Payer: BC Managed Care – PPO | Source: Ambulatory Visit | Attending: Obstetrics and Gynecology | Admitting: Obstetrics and Gynecology

## 2021-05-03 ENCOUNTER — Other Ambulatory Visit
Admission: RE | Admit: 2021-05-03 | Discharge: 2021-05-03 | Disposition: A | Discharge status: Home / Self Care | Payer: BC Managed Care – PPO | Source: Ambulatory Visit | Attending: Obstetrics and Gynecology | Admitting: Obstetrics and Gynecology

## 2021-05-03 NOTE — Progress Notes (Signed)
Attempted to call patient 2x to verify Medical Day appointment for infusion appointment time, no answer to either call.

## 2021-05-03 NOTE — Pre-Procedure Instructions (Signed)
Attempted to call and no answer, LVM to call me back for Preop phone appointment. Called Surgeon's office and spoke with Judson Roch to see if she can get in touch with patient and have her call us back.

## 2021-05-05 ENCOUNTER — Ambulatory Visit
Admission: RE | Admit: 2021-05-05 | Discharge: 2021-05-05 | Disposition: A | Discharge status: Home / Self Care | Payer: BC Managed Care – PPO | Source: Ambulatory Visit | Attending: Obstetrics and Gynecology | Admitting: Obstetrics and Gynecology

## 2021-05-05 ENCOUNTER — Other Ambulatory Visit: Payer: Self-pay

## 2021-05-05 DIAGNOSIS — D509 Iron deficiency anemia, unspecified: Secondary | ICD-10-CM | POA: Insufficient documentation

## 2021-05-05 MED ORDER — IRON SUCROSE 20 MG/ML IV SOLN
300.0000 mg | INTRAVENOUS | Status: DC
  Administered 2021-05-05: 300 mg via INTRAVENOUS
  Filled 2021-05-05: qty 300

## 2021-05-06 ENCOUNTER — Ambulatory Visit
Admission: EM | Admit: 2021-05-06 | Discharge: 2021-05-06 | Disposition: A | Discharge status: Home / Self Care | Payer: BC Managed Care – PPO

## 2021-05-06 ENCOUNTER — Encounter: Payer: Self-pay | Admitting: Emergency Medicine

## 2021-05-06 ENCOUNTER — Other Ambulatory Visit: Payer: Self-pay

## 2021-05-06 DIAGNOSIS — R197 Diarrhea, unspecified: Secondary | ICD-10-CM

## 2021-05-06 NOTE — Discharge Instructions (Addendum)
Increase your oral dietary intake of fiber to add bulk to your stools and decrease your diarrhea.   Continue to hydrate orally with water, Pedialyte, and broth.  There is no limitations to your diet as long as you can tolerate eating without nausea.  If you develop nausea and vomiting where you cannot keep down fluids, you develop continuous, sharp abdominal pain, or fever return for reevaluation or go to the emergency department.

## 2021-05-06 NOTE — ED Triage Notes (Signed)
Patient states that she started having diarrhea that started this morning.  Patient states that she got an iron infusion yesterday.  Patient denies fevers.

## 2021-05-06 NOTE — ED Provider Notes (Addendum)
[facility]    CSN: [id number] Arrival date & time: 05/06/21  1649      History   Chief Complaint Chief Complaint  Patient presents with   Diarrhea    HPI Nicole [name] is a 40 y.o. female.   HPI  40 year old female here for evaluation of diarrhea.  Patient reports that she received an iron infusion yesterday.  She was given 300 mg of IV Venofer.  She states that she does not believe she ate anything that was suspect yesterday associate with tater tots.  She states that this morning she woke up with some lower abdominal cramping, had 3 diarrhea stools, and still has some intermittent cramping.  Patient Nicole [name] that 2 of the stools were episodes of incontinence on herself.  She denies fever, nausea, or vomiting.  She reports that she has been able to eat and drink.  Patient states that her symptoms started when she was on her way to work this morning.  She is post to work the weekend and she is concerned about continuing to have diarrhea.  Past Medical History:  Diagnosis Date   Fibroid, uterine    Inguinal hernia    Menorrhagia    Ovarian cyst     There are no problems to display for this patient.   History reviewed. No pertinent surgical history.  OB History   No obstetric history on file.      Home Medications    Prior to Admission medications   Medication Sig Start Date End Date Taking? Authorizing Provider  ibuprofen (ADVIL) 800 MG tablet Take 1 tablet (800 mg total) by mouth 3 (three) times daily as needed for moderate pain, cramping or mild pain. 03/03/21  Yes Cook, Jayce G, DO  irbesartan (AVAPRO) 150 MG tablet Take 150 mg by mouth daily. 08/18/20  Yes [provider]  meclizine (ANTIVERT) 25 MG tablet Take 1 tablet (25 mg total) by mouth 3 (three) times daily as needed for dizziness. 02/16/21   Verda Cumins, MD  pantoprazole (PROTONIX) 40 MG tablet Take 1 tablet (40 mg total) by mouth daily. 11/11/20 11/15/20  Coral Spikes, DO    Family  History Family History  Problem Relation Age of Onset   Healthy Mother     Social History Social History   Tobacco Use   Smoking status: Every Day    Packs/day: 0.50    Types: Cigars, Cigarettes   Smokeless tobacco: Never  Vaping Use   Vaping Use: Never used  Substance Use Topics   Alcohol use: Yes   Drug use: Never     Allergies   Hornet venom   Review of Systems Review of Systems  Constitutional:  Negative for activity change, appetite change and fever.  Gastrointestinal:  Positive for abdominal pain and diarrhea. Negative for blood in stool, nausea and vomiting.  Hematological: Negative.   Psychiatric/Behavioral: Negative.      Physical Exam Triage Vital Signs ED Triage Vitals  Enc Vitals Group     BP 05/06/21 1656 (!) 140/95     Pulse Rate 05/06/21 1656 76     Resp 05/06/21 1656 14     Temp 05/06/21 1656 98.6 F (37 C)     Temp Source 05/06/21 1656 Oral     SpO2 05/06/21 1656 100 %     Weight 05/06/21 1653 190 lb (86.2 kg)     Height 05/06/21 1653 5\' 6"  (1.676 m)     Head Circumference --  Peak Flow --      Pain Score 05/06/21 1653 4     Pain Loc --      Pain Edu? --      Excl. in [place]? --    No data found.  Updated Vital Signs BP (!) 140/95 (BP Location: Left Arm)   Pulse 76   Temp 98.6 F (37 C) (Oral)   Resp 14   Ht 5\' 6"  (1.676 m)   Wt 190 lb (86.2 kg)   LMP 04/20/2021   SpO2 100%   BMI 30.67 kg/m   Visual Acuity Right Eye Distance:   Left Eye Distance:   Bilateral Distance:    Right Eye Near:   Left Eye Near:    Bilateral Near:     Physical Exam Vitals and nursing note reviewed.  Constitutional:      General: She is not in acute distress.    Appearance: Normal appearance. She is not ill-appearing.  HENT:     Head: Normocephalic and atraumatic.  Cardiovascular:     Rate and Rhythm: Normal rate and regular rhythm.     Pulses: Normal pulses.     Heart sounds: Normal heart sounds. No murmur heard.   No gallop.   Pulmonary:     Effort: Pulmonary effort is normal.     Breath sounds: No wheezing, rhonchi or rales.  Abdominal:     General: Abdomen is flat. Bowel sounds are normal.     Palpations: Abdomen is soft.     Tenderness: There is no abdominal tenderness. There is no guarding or rebound.  Skin:    General: Skin is warm and dry.     Capillary Refill: Capillary refill takes less than 2 seconds.     Findings: No erythema or rash.  Neurological:     Mental Status: She is alert and oriented to person, place, and time.  Psychiatric:        Mood and Affect: Mood normal.        Behavior: Behavior normal.        Thought Content: Thought content normal.        Judgment: Judgment normal.     UC Treatments / Results  Labs (all labs ordered are listed, but only abnormal results are displayed) Labs Reviewed - No data to display  EKG   Radiology No results found.  Procedures Procedures (including critical care time)  Medications Ordered in UC Medications - No data to display  Initial Impression / Assessment and Plan / UC Course  I have reviewed the triage vital signs and the nursing notes.  Pertinent labs & imaging results that were available during my care of the patient were reviewed by me and considered in my medical decision making (see chart for details).  Patient is a very pleasant, nontoxic-appearing 40 year old female here for evaluation of diarrhea that started this morning and has been 3 episodes in total.  Patient denies any blackness to her stools or any blood when she wipes.  She states that the diarrhea has been normal in color.  She is had no nausea vomiting or fever associated with this.  She states that this started while she was on her way to work where she suffered an episode of incontinence.  She reports that she was driving home and suffered a second episode of incontinence.  She has been able to eat and drink and is not suffering from any dizziness, weakness, or  abdominal pain.  Patient's exam reveals a benign  cardiopulmonary exam with clear lung sounds in all fields.  Abdomen is soft, flat, nontender, with positive bowel sounds in all 4 quadrants.  Suspect patient's symptoms are secondary to the Venofer infusion she received yesterday and should resolve on their own.  Patient advised to increase her oral intake of dietary fiber to help bulk to her stools and help decrease the diarrheal stool she is having.  Also advised her to continue to hydrate by mouth.  If any of her symptoms worsen, she develops a fever, she develops constant sharp abdominal pain, or she develops vomiting and is unable to take fluids by mouth she should return for reevaluation.  Work note provided.   Final Clinical Impressions(s) / UC Diagnoses   Final diagnoses:  Diarrhea, unspecified type     Discharge Instructions      Increase your oral dietary intake of fiber to add bulk to your stools and decrease your diarrhea.   Continue to hydrate orally with water, Pedialyte, and broth.  There is no limitations to your diet as long as you can tolerate eating without nausea.  If you develop nausea and vomiting where you cannot keep down fluids, you develop continuous, sharp abdominal pain, or fever return for reevaluation or go to the emergency department.     ED Prescriptions   None    PDMP not reviewed this encounter.   Margarette Canada, NP 05/06/21 1728    Margarette Canada, NP 05/06/21 1730

## 2021-05-10 ENCOUNTER — Other Ambulatory Visit: Admission: RE | Admit: 2021-05-10 | Payer: BC Managed Care – PPO | Source: Ambulatory Visit

## 2021-05-10 ENCOUNTER — Ambulatory Visit: Admission: RE | Admit: 2021-05-10 | Payer: BC Managed Care – PPO | Source: Ambulatory Visit

## 2021-05-10 ENCOUNTER — Other Ambulatory Visit: Payer: Self-pay

## 2021-05-10 ENCOUNTER — Other Ambulatory Visit
Admission: RE | Admit: 2021-05-10 | Discharge: 2021-05-10 | Disposition: A | Discharge status: Home / Self Care | Payer: BC Managed Care – PPO | Source: Ambulatory Visit | Attending: Obstetrics and Gynecology | Admitting: Obstetrics and Gynecology

## 2021-05-10 HISTORY — DX: COVID-19: U07.1

## 2021-05-10 HISTORY — DX: Essential (primary) hypertension: I10

## 2021-05-10 HISTORY — DX: Anemia, unspecified: D64.9

## 2021-05-10 NOTE — Patient Instructions (Addendum)
Your procedure is scheduled on: 05/12/21 - Thursday Report to the Registration Desk on the 1st floor of the [facility]. To find out your arrival time, please call [phone] between 1PM - 3PM on: 05/11/21 - Wednesday  REMEMBER: Instructions that are not followed completely may result in serious medical risk, up to and including death; or upon the discretion of your surgeon and anesthesiologist your surgery may need to be rescheduled.  Do not eat food after midnight the night before surgery.  No gum chewing, lozengers or hard candies.  You may however, drink CLEAR liquids up to 2 hours before you are scheduled to arrive for your surgery. Do not drink anything within 2 hours of your scheduled arrival time.  Clear liquids include: - water  - apple juice without pulp - gatorade (not RED, PURPLE, OR BLUE) - black coffee or tea (Do NOT add milk or creamers to the coffee or tea) Do NOT drink anything that is not on this list.  TAKE THESE MEDICATIONS THE MORNING OF SURGERY WITH A SIP OF WATER: NONE  One week prior to surgery: Stop Anti-inflammatories (NSAIDS) such as Advil, Aleve, Ibuprofen, Motrin, Naproxen, Naprosyn and Aspirin based products such as Excedrin, Goodys Powder, BC Powder.  Stop ANY OVER THE COUNTER supplements until after surgery.  You may however, continue to take Tylenol if needed for pain up until the day of surgery.  No Alcohol for 24 hours before or after surgery.  No Smoking including e-cigarettes for 24 hours prior to surgery.  No chewable tobacco products for at least 6 hours prior to surgery.  No nicotine patches on the day of surgery.  Do not use any "recreational" drugs for at least a week prior to your surgery.  Please be advised that the combination of cocaine and anesthesia may have negative outcomes, up to and including death. If you test positive for cocaine, your surgery will be cancelled.  On the morning of surgery brush your teeth with toothpaste  and water, you may rinse your mouth with mouthwash if you wish. Do not swallow any toothpaste or mouthwash.  Use CHG Soap or wipes as directed on instruction sheet.  Do not wear jewelry, make-up, hairpins, clips or nail polish.  Do not wear lotions, powders, or perfumes.   Do not shave body from the neck down 48 hours prior to surgery just in case you cut yourself which could leave a site for infection.  Also, freshly shaved skin may become irritated if using the CHG soap.  Contact lenses, hearing aids and dentures may not be worn into surgery.  Do not bring valuables to the hospital. [facility] is not responsible for any missing/lost belongings or valuables.   Notify your doctor if there is any change in your medical condition (cold, fever, infection).  Wear comfortable clothing (specific to your surgery type) to the hospital.  After surgery, you can help prevent lung complications by doing breathing exercises.  Take deep breaths and cough every 1-2 hours. Your doctor may order a device called an Incentive Spirometer to help you take deep breaths. When coughing or sneezing, hold a pillow firmly against your incision with both hands. This is called "splinting." Doing this helps protect your incision. It also decreases belly discomfort.  If you are being admitted to the hospital overnight, leave your suitcase in the car. After surgery it may be brought to your room.  If you are being discharged the day of surgery, you will not be allowed  to drive home. You will need a responsible adult (18 years or older) to drive you home and stay with you that night.   If you are taking public transportation, you will need to have a responsible adult (18 years or older) with you. Please confirm with your physician that it is acceptable to use public transportation.   Please call the [facility] Dept. at [phone] if you have any questions about these instructions.  Surgery  Visitation Policy:  Patients undergoing a surgery or procedure may have one family member or support person with them as long as that person is not COVID-19 positive or experiencing its symptoms.  That person may remain in the waiting area during the procedure and may rotate out with other people.  Inpatient Visitation:    Visiting hours are 7 a.m. to 8 p.m. Up to two visitors ages 16+ are allowed at one time in a patient room. The visitors may rotate out with other people during the day. Visitors must check out when they leave, or other visitors will not be allowed. One designated support person may remain overnight. The visitor must pass COVID-19 screenings, use hand sanitizer when entering and exiting the patient's room and wear a mask at all times, including in the patient's room. Patients must also wear a mask when staff or their visitor are in the room. Masking is required regardless of vaccination status.

## 2021-05-11 ENCOUNTER — Other Ambulatory Visit: Admission: RE | Admit: 2021-05-11 | Payer: BC Managed Care – PPO | Source: Ambulatory Visit

## 2021-05-11 ENCOUNTER — Ambulatory Visit
Admission: RE | Admit: 2021-05-11 | Discharge: 2021-05-11 | Disposition: A | Discharge status: Home / Self Care | Payer: BC Managed Care – PPO | Source: Ambulatory Visit | Attending: Obstetrics and Gynecology | Admitting: Obstetrics and Gynecology

## 2021-05-11 ENCOUNTER — Encounter: Payer: Self-pay | Admitting: General Surgery

## 2021-05-11 DIAGNOSIS — Z79899 Other long term (current) drug therapy: Secondary | ICD-10-CM | POA: Diagnosis not present

## 2021-05-11 DIAGNOSIS — D509 Iron deficiency anemia, unspecified: Secondary | ICD-10-CM | POA: Diagnosis not present

## 2021-05-11 DIAGNOSIS — Z01812 Encounter for preprocedural laboratory examination: Secondary | ICD-10-CM | POA: Insufficient documentation

## 2021-05-11 DIAGNOSIS — N920 Excessive and frequent menstruation with regular cycle: Secondary | ICD-10-CM | POA: Diagnosis present

## 2021-05-11 DIAGNOSIS — F17200 Nicotine dependence, unspecified, uncomplicated: Secondary | ICD-10-CM | POA: Diagnosis not present

## 2021-05-11 DIAGNOSIS — Z91030 Bee allergy status: Secondary | ICD-10-CM | POA: Diagnosis not present

## 2021-05-11 DIAGNOSIS — N803 Endometriosis of pelvic peritoneum: Secondary | ICD-10-CM | POA: Diagnosis not present

## 2021-05-11 DIAGNOSIS — Z8616 Personal history of COVID-19: Secondary | ICD-10-CM | POA: Diagnosis not present

## 2021-05-11 DIAGNOSIS — N83291 Other ovarian cyst, right side: Secondary | ICD-10-CM | POA: Diagnosis not present

## 2021-05-11 DIAGNOSIS — N8311 Corpus luteum cyst of right ovary: Secondary | ICD-10-CM | POA: Diagnosis not present

## 2021-05-11 DIAGNOSIS — N736 Female pelvic peritoneal adhesions (postinfective): Secondary | ICD-10-CM | POA: Diagnosis not present

## 2021-05-11 DIAGNOSIS — D251 Intramural leiomyoma of uterus: Secondary | ICD-10-CM | POA: Diagnosis not present

## 2021-05-11 LAB — CBC
HCT: 33.5 % — ABNORMAL LOW (ref 36.0–46.0)
Hemoglobin: 9.6 g/dL — ABNORMAL LOW (ref 12.0–15.0)
MCH: 20.6 pg — ABNORMAL LOW (ref 26.0–34.0)
MCHC: 28.7 g/dL — ABNORMAL LOW (ref 30.0–36.0)
MCV: 72 fL — ABNORMAL LOW (ref 80.0–100.0)
Platelets: 245 10*3/uL (ref 150–400)
RBC: 4.65 MIL/uL (ref 3.87–5.11)
RDW: 24.3 % — ABNORMAL HIGH (ref 11.5–15.5)
WBC: 3.6 10*3/uL — ABNORMAL LOW (ref 4.0–10.5)
nRBC: 0 % (ref 0.0–0.2)

## 2021-05-11 LAB — BASIC METABOLIC PANEL
Anion gap: 7 (ref 5–15)
BUN: 12 mg/dL (ref 6–20)
CO2: 24 mmol/L (ref 22–32)
Calcium: 9 mg/dL (ref 8.9–10.3)
Chloride: 109 mmol/L (ref 98–111)
Creatinine, Ser: 0.51 mg/dL (ref 0.44–1.00)
GFR, Estimated: >60 mL/min (ref >60)
Glucose, Bld: 102 mg/dL — ABNORMAL HIGH (ref 70–99)
Potassium: 4 mmol/L (ref 3.5–5.1)
Sodium: 140 mmol/L (ref 135–145)

## 2021-05-11 LAB — TYPE AND SCREEN
ABO/RH(D): B POS
Antibody Screen: NEGATIVE

## 2021-05-11 MED ORDER — POVIDONE-IODINE 10 % EX SWAB
2.0000 "application " | Freq: Once | CUTANEOUS | Status: AC
  Administered 2021-05-12: 2 via TOPICAL

## 2021-05-11 MED ORDER — LACTATED RINGERS IV SOLN: INTRAVENOUS | Status: DC

## 2021-05-11 MED ORDER — SODIUM CHLORIDE 0.9 % IV SOLN
300.0000 mg | Freq: Once | INTRAVENOUS | Status: AC
  Administered 2021-05-11: 300 mg via INTRAVENOUS
  Filled 2021-05-11: qty 300

## 2021-05-11 MED ORDER — FAMOTIDINE 20 MG PO TABS
20.0000 mg | ORAL_TABLET | Freq: Once | ORAL | Status: AC
Start: 2021-05-11 — End: 2021-05-12
  Administered 2021-05-12: 20 mg via ORAL

## 2021-05-11 MED ORDER — CEFAZOLIN SODIUM-DEXTROSE 2-4 GM/100ML-% IV SOLN
2.0000 g | Freq: Once | INTRAVENOUS | Status: AC
  Administered 2021-05-12: 2 g via INTRAVENOUS

## 2021-05-11 MED ORDER — ACETAMINOPHEN 500 MG PO TABS
1000.0000 mg | ORAL_TABLET | ORAL | Status: AC
  Administered 2021-05-12: 1000 mg via ORAL

## 2021-05-11 MED ORDER — GABAPENTIN 300 MG PO CAPS
300.0000 mg | ORAL_CAPSULE | ORAL | Status: AC
  Administered 2021-05-12: 300 mg via ORAL

## 2021-05-12 ENCOUNTER — Ambulatory Visit: Payer: BC Managed Care – PPO | Admitting: Registered Nurse

## 2021-05-12 ENCOUNTER — Encounter: Payer: Self-pay | Admitting: Obstetrics and Gynecology

## 2021-05-12 ENCOUNTER — Other Ambulatory Visit: Payer: Self-pay

## 2021-05-12 ENCOUNTER — Ambulatory Visit
Admission: RE | Admit: 2021-05-12 | Discharge: 2021-05-12 | Disposition: A | Discharge status: Home / Self Care | Payer: BC Managed Care – PPO | Attending: Obstetrics and Gynecology | Admitting: Obstetrics and Gynecology

## 2021-05-12 ENCOUNTER — Encounter
Admission: RE | Disposition: A | Discharge status: Home / Self Care | Payer: Self-pay | Source: Home / Self Care | Attending: Obstetrics and Gynecology

## 2021-05-12 DIAGNOSIS — D509 Iron deficiency anemia, unspecified: Secondary | ICD-10-CM | POA: Diagnosis not present

## 2021-05-12 DIAGNOSIS — N736 Female pelvic peritoneal adhesions (postinfective): Secondary | ICD-10-CM | POA: Diagnosis not present

## 2021-05-12 DIAGNOSIS — N920 Excessive and frequent menstruation with regular cycle: Secondary | ICD-10-CM | POA: Insufficient documentation

## 2021-05-12 DIAGNOSIS — N803 Endometriosis of pelvic peritoneum: Secondary | ICD-10-CM | POA: Diagnosis not present

## 2021-05-12 DIAGNOSIS — Z79899 Other long term (current) drug therapy: Secondary | ICD-10-CM | POA: Insufficient documentation

## 2021-05-12 DIAGNOSIS — D251 Intramural leiomyoma of uterus: Secondary | ICD-10-CM | POA: Insufficient documentation

## 2021-05-12 DIAGNOSIS — Z91030 Bee allergy status: Secondary | ICD-10-CM | POA: Insufficient documentation

## 2021-05-12 DIAGNOSIS — N83291 Other ovarian cyst, right side: Secondary | ICD-10-CM | POA: Diagnosis not present

## 2021-05-12 DIAGNOSIS — Z8616 Personal history of COVID-19: Secondary | ICD-10-CM | POA: Diagnosis not present

## 2021-05-12 DIAGNOSIS — F17200 Nicotine dependence, unspecified, uncomplicated: Secondary | ICD-10-CM | POA: Insufficient documentation

## 2021-05-12 DIAGNOSIS — N8311 Corpus luteum cyst of right ovary: Secondary | ICD-10-CM | POA: Insufficient documentation

## 2021-05-12 HISTORY — PX: LAPAROSCOPIC SUPRACERVICAL HYSTERECTOMY: SHX5399

## 2021-05-12 HISTORY — PX: LAPAROSCOPIC BILATERAL SALPINGECTOMY: SHX5889

## 2021-05-12 LAB — POCT PREGNANCY, URINE: Preg Test, Ur: NEGATIVE

## 2021-05-12 LAB — ABO/RH: ABO/RH(D): B POS

## 2021-05-12 SURGERY — HYSTERECTOMY, SUPRACERVICAL, LAPAROSCOPIC
Anesthesia: General | Laterality: Right

## 2021-05-12 MED ORDER — FAMOTIDINE 20 MG PO TABS
ORAL_TABLET | ORAL | Status: AC
  Filled 2021-05-12: qty 1

## 2021-05-12 MED ORDER — SUCCINYLCHOLINE CHLORIDE 200 MG/10ML IV SOSY
PREFILLED_SYRINGE | INTRAVENOUS | Status: AC
  Filled 2021-05-12: qty 10

## 2021-05-12 MED ORDER — LIDOCAINE HCL (PF) 2 % IJ SOLN
INTRAMUSCULAR | Status: AC
  Filled 2021-05-12: qty 5

## 2021-05-12 MED ORDER — FENTANYL CITRATE (PF) 100 MCG/2ML IJ SOLN
INTRAMUSCULAR | Status: DC | PRN
  Administered 2021-05-12 (×2): 50 ug via INTRAVENOUS

## 2021-05-12 MED ORDER — ROCURONIUM BROMIDE 10 MG/ML (PF) SYRINGE
PREFILLED_SYRINGE | INTRAVENOUS | Status: AC
  Filled 2021-05-12: qty 10

## 2021-05-12 MED ORDER — ORAL CARE MOUTH RINSE: 15.0000 mL | Freq: Once | OROMUCOSAL | Status: AC

## 2021-05-12 MED ORDER — 0.9 % SODIUM CHLORIDE (POUR BTL) OPTIME
TOPICAL | Status: DC | PRN
  Administered 2021-05-12: 300 mL

## 2021-05-12 MED ORDER — FENTANYL CITRATE (PF) 100 MCG/2ML IJ SOLN
25.0000 ug | INTRAMUSCULAR | Status: DC | PRN
  Administered 2021-05-12: 25 ug via INTRAVENOUS
  Administered 2021-05-12: 50 ug via INTRAVENOUS
  Administered 2021-05-12: 25 ug via INTRAVENOUS
  Administered 2021-05-12: 50 ug via INTRAVENOUS

## 2021-05-12 MED ORDER — FENTANYL CITRATE (PF) 100 MCG/2ML IJ SOLN
INTRAMUSCULAR | Status: AC
  Filled 2021-05-12: qty 2

## 2021-05-12 MED ORDER — HYDROCODONE-ACETAMINOPHEN 5-325 MG PO TABS
1.0000 | ORAL_TABLET | ORAL | Status: DC | PRN
  Administered 2021-05-12: 1 via ORAL

## 2021-05-12 MED ORDER — PROPOFOL 10 MG/ML IV BOLUS
INTRAVENOUS | Status: AC
  Filled 2021-05-12: qty 20

## 2021-05-12 MED ORDER — ACETAMINOPHEN 500 MG PO TABS
ORAL_TABLET | ORAL | Status: AC
  Filled 2021-05-12: qty 2

## 2021-05-12 MED ORDER — PROPOFOL 10 MG/ML IV BOLUS
INTRAVENOUS | Status: DC | PRN
  Administered 2021-05-12: 150 mg via INTRAVENOUS

## 2021-05-12 MED ORDER — CEFAZOLIN SODIUM-DEXTROSE 2-4 GM/100ML-% IV SOLN
INTRAVENOUS | Status: AC
  Filled 2021-05-12: qty 100

## 2021-05-12 MED ORDER — EPHEDRINE SULFATE 50 MG/ML IJ SOLN
INTRAMUSCULAR | Status: DC | PRN
  Administered 2021-05-12: 10 mg via INTRAVENOUS
  Administered 2021-05-12: 15 mg via INTRAVENOUS

## 2021-05-12 MED ORDER — BUPIVACAINE HCL 0.5 % IJ SOLN
INTRAMUSCULAR | Status: DC | PRN
  Administered 2021-05-12: 13 mL

## 2021-05-12 MED ORDER — MIDAZOLAM HCL 2 MG/2ML IJ SOLN
INTRAMUSCULAR | Status: DC | PRN
  Administered 2021-05-12: 2 mg via INTRAVENOUS

## 2021-05-12 MED ORDER — KETOROLAC TROMETHAMINE 30 MG/ML IJ SOLN
INTRAMUSCULAR | Status: DC | PRN
  Administered 2021-05-12: 30 mg via INTRAVENOUS

## 2021-05-12 MED ORDER — PROMETHAZINE HCL 25 MG/ML IJ SOLN: 6.2500 mg | INTRAMUSCULAR | Status: DC | PRN

## 2021-05-12 MED ORDER — SILVER NITRATE-POT NITRATE 75-25 % EX MISC
CUTANEOUS | Status: DC | PRN
  Administered 2021-05-12: 2

## 2021-05-12 MED ORDER — PHENYLEPHRINE HCL (PRESSORS) 10 MG/ML IV SOLN
INTRAVENOUS | Status: AC
  Filled 2021-05-12: qty 1

## 2021-05-12 MED ORDER — HYDROCODONE-ACETAMINOPHEN 5-325 MG PO TABS
ORAL_TABLET | ORAL | Status: AC
  Filled 2021-05-12: qty 1

## 2021-05-12 MED ORDER — ONDANSETRON HCL 4 MG/2ML IJ SOLN
INTRAMUSCULAR | Status: AC
  Administered 2021-05-12: 4 mg
  Filled 2021-05-12: qty 2

## 2021-05-12 MED ORDER — DEXMEDETOMIDINE (PRECEDEX) IN NS 20 MCG/5ML (4 MCG/ML) IV SYRINGE
PREFILLED_SYRINGE | INTRAVENOUS | Status: DC | PRN
  Administered 2021-05-12 (×2): 8 ug via INTRAVENOUS

## 2021-05-12 MED ORDER — BUPIVACAINE HCL (PF) 0.5 % IJ SOLN
INTRAMUSCULAR | Status: AC
  Filled 2021-05-12: qty 30

## 2021-05-12 MED ORDER — LIDOCAINE HCL (CARDIAC) PF 100 MG/5ML IV SOSY
PREFILLED_SYRINGE | INTRAVENOUS | Status: DC | PRN
  Administered 2021-05-12: 100 mg via INTRAVENOUS

## 2021-05-12 MED ORDER — SEVOFLURANE IN SOLN
RESPIRATORY_TRACT | Status: AC
  Filled 2021-05-12: qty 250

## 2021-05-12 MED ORDER — CHLORHEXIDINE GLUCONATE 0.12 % MT SOLN
OROMUCOSAL | Status: AC
  Administered 2021-05-12: 15 mL via OROMUCOSAL
  Filled 2021-05-12: qty 15

## 2021-05-12 MED ORDER — CHLORHEXIDINE GLUCONATE 0.12 % MT SOLN: 15.0000 mL | Freq: Once | OROMUCOSAL | Status: AC

## 2021-05-12 MED ORDER — ROCURONIUM BROMIDE 100 MG/10ML IV SOLN
INTRAVENOUS | Status: DC | PRN
  Administered 2021-05-12: 50 mg via INTRAVENOUS

## 2021-05-12 MED ORDER — ONDANSETRON HCL 4 MG/2ML IJ SOLN: 4.0000 mg | Freq: Once | INTRAMUSCULAR | Status: DC

## 2021-05-12 MED ORDER — GABAPENTIN 300 MG PO CAPS
ORAL_CAPSULE | ORAL | Status: AC
  Filled 2021-05-12: qty 1

## 2021-05-12 MED ORDER — RINGERS IRRIGATION IR SOLN
Status: DC | PRN
  Administered 2021-05-12: 300 mL

## 2021-05-12 MED ORDER — SUGAMMADEX SODIUM 200 MG/2ML IV SOLN
INTRAVENOUS | Status: DC | PRN
  Administered 2021-05-12: 200 mg via INTRAVENOUS

## 2021-05-12 MED ORDER — SILVER NITRATE-POT NITRATE 75-25 % EX MISC
CUTANEOUS | Status: AC
  Filled 2021-05-12: qty 10

## 2021-05-12 MED ORDER — ONDANSETRON HCL 4 MG PO TABS: 8.0000 mg | ORAL_TABLET | Freq: Three times a day (TID) | ORAL | Status: DC | PRN

## 2021-05-12 MED ORDER — LACTATED RINGERS IV SOLN: INTRAVENOUS | Status: DC

## 2021-05-12 MED ORDER — MIDAZOLAM HCL 2 MG/2ML IJ SOLN
INTRAMUSCULAR | Status: AC
  Filled 2021-05-12: qty 4

## 2021-05-12 MED ORDER — DEXAMETHASONE SODIUM PHOSPHATE 10 MG/ML IJ SOLN
INTRAMUSCULAR | Status: DC | PRN
  Administered 2021-05-12: 10 mg via INTRAVENOUS

## 2021-05-12 MED ORDER — PHENYLEPHRINE HCL (PRESSORS) 10 MG/ML IV SOLN
INTRAVENOUS | Status: DC | PRN
  Administered 2021-05-12: 100 ug via INTRAVENOUS

## 2021-05-12 SURGICAL SUPPLY — 53 items
APL PRP STRL LF DISP 70% ISPRP (MISCELLANEOUS)
APL SRG 38 LTWT LNG FL B (MISCELLANEOUS)
APL SWBSTK 6 STRL LF DISP (MISCELLANEOUS) ×3
APPLICATOR ARISTA FLEXITIP XL (MISCELLANEOUS) IMPLANT
APPLICATOR COTTON TIP 6 STRL (MISCELLANEOUS) ×3 IMPLANT
APPLICATOR COTTON TIP 6IN STRL (MISCELLANEOUS) ×4
BAG DRN RND TRDRP ANRFLXCHMBR (UROLOGICAL SUPPLIES) ×3
BAG URINE DRAIN 2000ML AR STRL (UROLOGICAL SUPPLIES) ×4 IMPLANT
BLADE SURG SZ11 CARB STEEL (BLADE) ×4 IMPLANT
CATH FOLEY 2WAY  5CC 16FR (CATHETERS) ×1
CATH FOLEY 2WAY 5CC 16FR (CATHETERS) ×3
CATH URTH 16FR FL 2W BLN LF (CATHETERS) ×3 IMPLANT
CHLORAPREP W/TINT 26 (MISCELLANEOUS) IMPLANT
DRSG TEGADERM 2-3/8X2-3/4 SM (GAUZE/BANDAGES/DRESSINGS) ×12 IMPLANT
DRSG TELFA 3X8 NADH (GAUZE/BANDAGES/DRESSINGS) ×4 IMPLANT
GAUZE 4X4 16PLY ~~LOC~~+RFID DBL (SPONGE) ×4 IMPLANT
GLOVE SURG SYN 8.0 (GLOVE) ×12 IMPLANT
GOWN STRL REUS W/ TWL LRG LVL3 (GOWN DISPOSABLE) ×9 IMPLANT
GOWN STRL REUS W/ TWL XL LVL3 (GOWN DISPOSABLE) ×15 IMPLANT
GOWN STRL REUS W/TWL LRG LVL3 (GOWN DISPOSABLE) ×12
GOWN STRL REUS W/TWL XL LVL3 (GOWN DISPOSABLE) ×20
GRASPER SUT TROCAR 14GX15 (MISCELLANEOUS) ×4 IMPLANT
HEMOSTAT ARISTA ABSORB 3G PWDR (HEMOSTASIS) IMPLANT
IRRIGATION STRYKERFLOW (MISCELLANEOUS) ×3 IMPLANT
IRRIGATOR STRYKERFLOW (MISCELLANEOUS) ×4
IV LACTATED RINGERS 1000ML (IV SOLUTION) ×4 IMPLANT
KIT PINK PAD W/HEAD ARE REST (MISCELLANEOUS) ×4
KIT PINK PAD W/HEAD ARM REST (MISCELLANEOUS) ×3 IMPLANT
KIT TURNOVER CYSTO (KITS) ×4 IMPLANT
LABEL OR SOLS (LABEL) ×4 IMPLANT
MANIFOLD NEPTUNE II (INSTRUMENTS) ×4 IMPLANT
MORCELLATOR XCISE  COR (MISCELLANEOUS) ×1
MORCELLATOR XCISE COR (MISCELLANEOUS) ×3 IMPLANT
NS IRRIG 500ML POUR BTL (IV SOLUTION) ×4 IMPLANT
PACK GYN LAPAROSCOPIC (MISCELLANEOUS) ×4 IMPLANT
PAD OB MATERNITY 4.3X12.25 (PERSONAL CARE ITEMS) IMPLANT
PAD PREP 24X41 OB/GYN DISP (PERSONAL CARE ITEMS) ×4 IMPLANT
SCRUB EXIDINE 4% CHG 4OZ (MISCELLANEOUS) ×4 IMPLANT
SET CYSTO W/LG BORE CLAMP LF (SET/KITS/TRAYS/PACK) IMPLANT
SET TUBE SMOKE EVAC HIGH FLOW (TUBING) ×4 IMPLANT
SHEARS HARMONIC ACE PLUS 36CM (ENDOMECHANICALS) ×4 IMPLANT
SLEEVE ENDOPATH XCEL 5M (ENDOMECHANICALS) ×4 IMPLANT
SOLUTION ELECTROLUBE (MISCELLANEOUS) ×4 IMPLANT
SPONGE GAUZE 2X2 8PLY STRL LF (GAUZE/BANDAGES/DRESSINGS) ×8 IMPLANT
STRIP CLOSURE SKIN 1/2X4 (GAUZE/BANDAGES/DRESSINGS) ×4 IMPLANT
SUT VIC AB 2-0 UR6 27 (SUTURE) ×4 IMPLANT
SUT VIC AB 4-0 SH 27 (SUTURE) ×4
SUT VIC AB 4-0 SH 27XANBCTRL (SUTURE) ×3 IMPLANT
SYR 10ML LL (SYRINGE) ×4 IMPLANT
SYR 20ML LL LF (SYRINGE) ×4 IMPLANT
TROCAR ENDO BLADELESS 11MM (ENDOMECHANICALS) ×4 IMPLANT
TROCAR XCEL NON-BLD 5MMX100MML (ENDOMECHANICALS) ×4 IMPLANT
WATER STERILE IRR 500ML POUR (IV SOLUTION) IMPLANT

## 2021-05-12 NOTE — Transfer of Care (Signed)
Immediate Anesthesia Transfer of Care Note  Patient: Maximino Sarin  Procedure(s) Performed: LAPAROSCOPIC SUPRACERVICAL HYSTERECTOMY LAPAROSCOPIC BILATERAL SALPINGECTOMY (Bilateral) LAPAROSCOPIC OOPHORECTOMY AND EXCISION OF ENDOMETRIOSIS (Right)  Patient Location: PACU  Anesthesia Type:General  Level of Consciousness: drowsy  Airway & Oxygen Therapy: Patient Spontanous Breathing and Patient connected to face mask oxygen  Post-op Assessment: Report given to RN and Post -op Vital signs reviewed and stable  Post vital signs: Reviewed and stable  Last Vitals:  Vitals Value Taken Time  BP 104/63 05/12/21 1020  Temp    Pulse 73 05/12/21 1025  Resp 15 05/12/21 1025  SpO2 100 % 05/12/21 1025  Vitals shown include unvalidated device data.  Last Pain:  Vitals:   05/12/21 0609  TempSrc: Oral         Complications: No notable events documented.

## 2021-05-12 NOTE — Discharge Instructions (Signed)
AMBULATORY SURGERY  ?DISCHARGE INSTRUCTIONS ? ? ?The drugs that you were given will stay in your system until tomorrow so for the next 24 hours you should not: ? ?Drive an automobile ?Make any legal decisions ?Drink any alcoholic beverage ? ? ?You may resume regular meals tomorrow.  Today it is better to start with liquids and gradually work up to solid foods. ? ?You may eat anything you prefer, but it is better to start with liquids, then soup and crackers, and gradually work up to solid foods. ? ? ?Please notify your doctor immediately if you have any unusual bleeding, trouble breathing, redness and pain at the surgery site, drainage, fever, or pain not relieved by medication. ? ? ? ?Additional Instructions: ? ? ? ?Please contact your physician with any problems or Same Day Surgery at [phone], Monday through Friday 6 am to 4 pm, or [facility] number at [phone].  ?

## 2021-05-12 NOTE — Brief Op Note (Signed)
05/12/2021  10:12 AM  PATIENT:  Maximino Sarin  40 y.o. female  PRE-OPERATIVE DIAGNOSIS:  menorrhagia, fibroids, iron deficient anemia  POST-OPERATIVE DIAGNOSIS:  menorrhagia, fibroids, iron deficient anemia, extensive endometriosis  PROCEDURE:  LSH , right salpingoophorectomy , left salpingectomy  , excision of endometriosis of left pelvic side wall   SURGEON:  Surgeon(s) and Role:    * Tyshon Fanning, Gwen Her, MD - Primary    * Benjaman Kindler, MD - Assisting  PHYSICIAN ASSISTANT: PA student BRUFF  ASSISTANTS: none   ANESTHESIA:   general  EBL:  50 mL , IOF 1000 cc UO 100cc  BLOOD ADMINISTERED:none  DRAINS: none   LOCAL MEDICATIONS USED:  MARCAINE     SPECIMEN:  Source of Specimen:  uterus bilateral tubes, right ovary and excision of left endometriosis  DISPOSITION OF SPECIMEN:  PATHOLOGY  COUNTS:  YES  TOURNIQUET:  * No tourniquets in log *  DICTATION: .Other Dictation: Dictation Number verbal  PLAN OF CARE: Discharge to home after PACU  PATIENT DISPOSITION:  PACU - hemodynamically stable.   Delay start of Pharmacological VTE agent (>24hrs) due to surgical blood loss or risk of bleeding: not applicable

## 2021-05-12 NOTE — Progress Notes (Signed)
Pt is ready for [facility] and BS  Labs reviewed. NPO . All questions answered  Proceed

## 2021-05-12 NOTE — Anesthesia Preprocedure Evaluation (Signed)
Anesthesia Evaluation  Patient identified by MRN, date of birth, ID band Patient awake    Reviewed: Allergy & Precautions, H&P , NPO status , Patient's Chart, lab work & pertinent test results, reviewed documented beta blocker date and time   History of Anesthesia Complications Negative for: history of anesthetic complications  Airway Mallampati: III  TM Distance: >3 FB Neck ROM: full    Dental  (+) Dental Advidsory Given, Chipped, Missing, Teeth Intact   Pulmonary neg shortness of breath, neg sleep apnea, neg COPD, neg recent URI, Current Smoker and Patient abstained from smoking.,    Pulmonary exam normal breath sounds clear to auscultation       Cardiovascular Exercise Tolerance: Good hypertension, (-) angina(-) Past MI and (-) Cardiac Stents Normal cardiovascular exam(-) dysrhythmias (-) Valvular Problems/Murmurs Rhythm:regular Rate:Normal     Neuro/Psych negative neurological ROS  negative psych ROS   GI/Hepatic Neg liver ROS, GERD  ,  Endo/Other  negative endocrine ROS  Renal/GU negative Renal ROS  negative genitourinary   Musculoskeletal   Abdominal   Peds  Hematology negative hematology ROS (+)   Anesthesia Other Findings Past Medical History: No date: Anemia No date: COVID-19 No date: Fibroid, uterine No date: Hypertension No date: Inguinal hernia No date: Menorrhagia No date: Ovarian cyst   Reproductive/Obstetrics negative OB ROS                             Anesthesia Physical Anesthesia Plan  ASA: 2  Anesthesia Plan: General   Post-op Pain Management:    Induction: Intravenous  PONV Risk Score and Plan: 2 and Ondansetron, Dexamethasone, Midazolam and Treatment may vary due to age or medical condition  Airway Management Planned: Oral ETT  Additional Equipment:   Intra-op Plan:   Post-operative Plan: Extubation in OR  Informed Consent: I have reviewed the  patients History and Physical, chart, labs and discussed the procedure including the risks, benefits and alternatives for the proposed anesthesia with the patient or authorized representative who has indicated his/her understanding and acceptance.     Dental Advisory Given  Plan Discussed with: Anesthesiologist, CRNA and Surgeon  Anesthesia Plan Comments:         Anesthesia Quick Evaluation

## 2021-05-12 NOTE — Anesthesia Procedure Notes (Signed)
Procedure Name: Intubation Date/Time: 05/12/2021 7:42 AM Performed by: Doreen Salvage, CRNA Pre-anesthesia Checklist: Patient identified, Patient being monitored, Timeout performed, Emergency Drugs available and Suction available Patient Re-evaluated:Patient Re-evaluated prior to induction Oxygen Delivery Method: Circle system utilized Preoxygenation: Pre-oxygenation with 100% oxygen Induction Type: IV induction Ventilation: Mask ventilation without difficulty Laryngoscope Size: Mac, 3 and McGraph Grade View: Grade II Tube type: Oral Tube size: 7.0 mm Number of attempts: 1 Airway Equipment and Method: Stylet Placement Confirmation: ETT inserted through vocal cords under direct vision, positive ETCO2 and breath sounds checked- equal and bilateral Secured at: 21 cm Tube secured with: Tape Dental Injury: Teeth and Oropharynx as per pre-operative assessment  Difficulty Due To: Difficult Airway- due to anterior larynx

## 2021-05-12 NOTE — Op Note (Signed)
NAME: [name], [name]. MEDICAL RECORD NO: [id number] ACCOUNT NO: [id number] DATE OF BIRTH: [birth year] FACILITY: [facility] LOCATION: [facility] PHYSICIAN: Boykin Nearing, MD  Operative Report   DATE OF PROCEDURE: 05/12/2021  PREOPERATIVE DIAGNOSES: 1.  Menorrhagia. 2.  Fibroid uterus.  POSTOPERATIVE DIAGNOSES:  1.  Menorrhagia. 2.  Fibroid uterus. 3.  Right ovarian cyst consistent with endometrioma. 4.  Endometriosis.  PROCEDURE:  1.  Laparoscopic supracervical hysterectomy. 2.  Right salpingo-oophorectomy. 3.  Left salpingectomy. 4.  Excision of left pelvic sidewall endometriosis.  SURGEON:  Laverta Baltimore, MD  FIRST ASSISTANT:  Benjaman Kindler, MD  SECOND ASSISTANT:  PA student, Dewain Penning.  ANESTHESIA:  General endotracheal anesthesia.  INDICATIONS:  This is a 40 year old gravida 1, para 1, status post cesarean section, who has had a long history of menorrhagia, uncontrolled by conservative therapy.  The patient has iron deficiency anemia.  DESCRIPTION OF PROCEDURE:  After adequate general endotracheal anesthesia, the patient was placed in dorsal supine position with the legs in the [place] stirrups.  The patient's abdomen, perineum and vagina were prepped and draped in normal sterile  fashion.  The patient did receive 2 grams IV Ancef for surgical prophylaxis prior to commencement of the case.  A single tooth tenaculum was placed on the anterior cervix followed by placement of the uterine sound into the endometrial cavity.  The two  were tethered together with Steri-Strips to be used for uterine manipulation during the procedure.  Foley catheter was placed as well.  Gloves and gown were changed and attention was directed to the patient's abdomen. Timeout had been previously  performed prior to placement of the tenaculum.  An infraumbilical incision was made after injection with 0.5% Marcaine.  A 5 mm hysteroscope was advanced into the abdominal cavity under direct  visualization.  The patient's abdomen was insufflated with  carbon dioxide.  Initial impression revealed a 4 x 3 cm lobulated cyst that was superior to the right ovarian uterine ligament, it had the appearance of a chocolate cyst or endometrioma.  There were several vesicles noted on both sides of the uterus and the left pelvic  sidewall showed significant stellate lesion superior to the left uterosacral ligament.  Attention was directed to the right fallopian tube, which was grasped at the distal portion and the Harmonic scalpel was used to dissect the fallopian tube to the  cornual attachment.  The fallopian tube was then removed to give better visualization for the procedure.  The left round ligament was then opened and the broad ligament was then opened as well.  The left uteroovarian ligament was cauterized, transected  and the broad ligament was then dissected to the level of the left uterine artery.  The bladder flap was noted with fair amount of scar tissue to the central anterior portion of the lower uterine segment.  This was taken down sharply with the Harmonic  scalpel.  The left uterine artery was then cauterized, transected and the cervix was then partially amputated at the level of the uterosacral ligament.  Attention was directed to the patient's right fallopian tube and ovary.  At this point, the decision  was made to remove the right ovary and fallopian tube, given the amount of endometriosis and the position of the adnexal cyst.  The right infundibulopelvic ligament was then cauterized with the Kleppinger cautery and transected and the right round  ligament was then cauterized and transected with Harmonic scalpel.  The broad ligament was then dissected and skeletonized to the level of  the right uterine artery.  The uterine artery was then cauterized and transected and cervical stroma was then cut  to the same level on the opposite side.  The uterine sound was then removed and the lower  uterine segment and the uterus was amputated and left in the pelvis until the morcellator could be brought up to the operative field.  A vaginal hand was used to  help cauterize the residual cervical stump.  The Kleppinger cautery was placed in the endocervix and cauterized without difficulty.  Gloves and gown were changed yet again and the morcellator was brought up and placed through the left lower port site.   The uterus, fallopian tubes and ovary on the right were then morcellated and removed through this left morcellating port site.  Of note, the ureters showed normal peristaltic activity prior to dissection and at the end of dissection, there was normal  peristaltic activity.  The stellate scarring of the left pelvic sidewall was then grasped with a [place] grasper and excision of this endometriosis occurred.  Good hemostasis was noted.  The patient's abdomen was irrigated and suctioned.  Pictures were  taken.  The intra-abdominal pressure was lowered to 7 mmHg and good hemostasis was noted.  The patient's abdomen was then deflated.  The left lower port site was closed with the fascial layer using the Carter-Thomason apparatus, good closure of the  fascia and then the abdomen was deflated and all skin incisions were closed with interrupted 4-0 Vicryl suture.  The single tooth tenaculum was removed from the cervix and silver nitrate was used on the tenaculum sites and the Foley catheter was removed.   There were no complications.  ESTIMATED BLOOD LOSS:  50 mL.  INTRAOPERATIVE FLUIDS:  1000 mL.  URINE OUTPUT:  100 mL.  The patient tolerated the procedure well and was taken to recovery room in good condition.   SHW D: 05/12/2021 5:29:49 pm T: 05/12/2021 8:42:00 pm  JOB: [id number]/ [id number]

## 2021-05-13 LAB — SURGICAL PATHOLOGY

## 2021-05-13 NOTE — Anesthesia Postprocedure Evaluation (Signed)
Anesthesia Post Note  Patient: Nicole [name]  Procedure(s) Performed: LAPAROSCOPIC SUPRACERVICAL HYSTERECTOMY LAPAROSCOPIC BILATERAL SALPINGECTOMY (Bilateral) LAPAROSCOPIC OOPHORECTOMY AND EXCISION OF ENDOMETRIOSIS (Right)  Patient location during evaluation: PACU Anesthesia Type: General Level of consciousness: awake and alert Pain management: pain level controlled Vital Signs Assessment: post-procedure vital signs reviewed and stable Respiratory status: spontaneous breathing, nonlabored ventilation, respiratory function stable and patient connected to nasal cannula oxygen Cardiovascular status: blood pressure returned to baseline and stable Postop Assessment: no apparent nausea or vomiting Anesthetic complications: no   No notable events documented.   Last Vitals:  Vitals:   05/12/21 1129 05/12/21 1227  BP: (!) 176/99 120/70  Pulse:  84  Resp:  (!) 22  Temp:    SpO2:  100%    Last Pain:  Vitals:   05/13/21 0844  TempSrc:   PainSc: 3                  Martha Clan

## 2021-07-29 ENCOUNTER — Ambulatory Visit
Admission: EM | Admit: 2021-07-29 | Discharge: 2021-07-29 | Disposition: A | Discharge status: Home / Self Care | Payer: BC Managed Care – PPO | Attending: Medical Oncology | Admitting: Medical Oncology

## 2021-07-29 ENCOUNTER — Other Ambulatory Visit: Payer: Self-pay

## 2021-07-29 DIAGNOSIS — Z8616 Personal history of COVID-19: Secondary | ICD-10-CM | POA: Insufficient documentation

## 2021-07-29 DIAGNOSIS — Z20822 Contact with and (suspected) exposure to covid-19: Secondary | ICD-10-CM | POA: Diagnosis not present

## 2021-07-29 DIAGNOSIS — J069 Acute upper respiratory infection, unspecified: Secondary | ICD-10-CM | POA: Insufficient documentation

## 2021-07-29 MED ORDER — IPRATROPIUM BROMIDE 0.06 % NA SOLN: 2.0000 | Freq: Four times a day (QID) | NASAL | 12 refills | Status: DC

## 2021-07-29 NOTE — ED Triage Notes (Signed)
Pt here to have Covid test done. Pt states she felt the same way last year with fatigue and headache and she had covid.

## 2021-07-29 NOTE — Discharge Instructions (Signed)
Isolate at home pending the results of your COVID test.  If you test positive then you will have to quarantine for 5 days from the start of your symptoms.  After 5 days you can break quarantine if your symptoms have improved and you have not had a fever for 24 hours without taking Tylenol or ibuprofen.  Use over-the-counter Tylenol and ibuprofen as needed for body aches and fever.  Use the Atrovent nasal spray, 2 squirts in each nostril every 6 hours, as needed for runny nose and postnasal drip.  Return for reevaluation or see your primary care provider for any new or worsening symptoms.  If you develop any increased shortness of breath-especially at rest, you are unable to speak in full sentences, or is a late sign your lips are turning blue you need to go the ER for evaluation.

## 2021-07-29 NOTE — ED Provider Notes (Signed)
[facility]    CSN: [id number] Arrival date & time: 07/29/21  1958      History   Chief Complaint Chief Complaint  Patient presents with   Headache    HPI Nicole [name] is a 40 y.o. female.   HPI  40 year old female here for evaluation of headache and fatigue.  Patient is here requesting COVID test because she developed runny nose, nasal congestion, headache, fatigue, nausea today.  She states this feels similar to when she developed COVID last year.  She has not had a fever, cough, vomiting, diarrhea, or body aches.  She states that she was recently around someone who was COVID positive.  They were masked, and so she, but they were in close proximity.  Past Medical History:  Diagnosis Date   Anemia    COVID-19    Fibroid, uterine    Hypertension    Inguinal hernia    Menorrhagia    Ovarian cyst     There are no problems to display for this patient.   Past Surgical History:  Procedure Laterality Date   LAPAROSCOPIC BILATERAL SALPINGECTOMY Bilateral 05/12/2021   Procedure: LAPAROSCOPIC BILATERAL SALPINGECTOMY;  Surgeon: Schermerhorn, Gwen Her, MD;  Location: [facility];  Service: Gynecology;  Laterality: Bilateral;   LAPAROSCOPIC SUPRACERVICAL HYSTERECTOMY N/A 05/12/2021   Procedure: LAPAROSCOPIC SUPRACERVICAL HYSTERECTOMY;  Surgeon: Schermerhorn, Gwen Her, MD;  Location: [facility];  Service: Gynecology;  Laterality: N/A;    OB History   No obstetric history on file.      Home Medications    Prior to Admission medications   Medication Sig Start Date End Date Taking? Authorizing Provider  ferrous [id number] C-folic acid (FEROCON) capsule Take 1 capsule by mouth daily.   Yes [provider]  ipratropium (ATROVENT) 0.06 % nasal spray Place 2 sprays into both nostrils 4 (four) times daily. 07/29/21  Yes Margarette Canada, NP  irbesartan (AVAPRO) 150 MG tablet Take 150 mg by mouth daily. 08/18/20  Yes [provider]  pantoprazole  (PROTONIX) 40 MG tablet Take 1 tablet (40 mg total) by mouth daily. 11/11/20 11/15/20  Coral Spikes, DO    Family History Family History  Problem Relation Age of Onset   Healthy Mother     Social History Social History   Tobacco Use   Smoking status: Every Day    Packs/day: 0.50    Types: Cigars, Cigarettes   Smokeless tobacco: Never  Vaping Use   Vaping Use: Never used  Substance Use Topics   Alcohol use: Yes    Comment: on weekends   Drug use: Never     Allergies   Hornet venom   Review of Systems Review of Systems  Constitutional:  Positive for fatigue. Negative for activity change, appetite change and fever.  HENT:  Positive for congestion and rhinorrhea. Negative for ear pain and sore throat.   Respiratory:  Negative for cough, shortness of breath and wheezing.   Gastrointestinal:  Positive for nausea.  Musculoskeletal:  Negative for arthralgias and myalgias.  Skin:  Negative for rash.  Neurological:  Positive for headaches.  Hematological: Negative.   Psychiatric/Behavioral: Negative.      Physical Exam Triage Vital Signs ED Triage Vitals  Enc Vitals Group     BP      Pulse      Resp      Temp      Temp src      SpO2      Weight  Height      Head Circumference      Peak Flow      Pain Score      Pain Loc      Pain Edu?      Excl. in [place]?    No data found.  Updated Vital Signs BP (!) 155/85 (BP Location: Right Arm)    Pulse 100    Temp 99 F (37.2 C) (Oral)    Resp 18    LMP 04/20/2021    SpO2 100%   Visual Acuity Right Eye Distance:   Left Eye Distance:   Bilateral Distance:    Right Eye Near:   Left Eye Near:    Bilateral Near:     Physical Exam Vitals and nursing note reviewed.  Constitutional:      General: She is not in acute distress.    Appearance: Normal appearance. She is normal weight. She is not ill-appearing.  HENT:     Head: Normocephalic and atraumatic.     Right Ear: Tympanic membrane, ear canal and external ear  normal. There is no impacted cerumen.     Left Ear: Tympanic membrane, ear canal and external ear normal. There is no impacted cerumen.     Nose: Congestion and rhinorrhea present.     Mouth/Throat:     Mouth: Mucous membranes are moist.     Pharynx: Oropharynx is clear. Posterior oropharyngeal erythema present.  Cardiovascular:     Rate and Rhythm: Normal rate and regular rhythm.     Pulses: Normal pulses.     Heart sounds: Normal heart sounds. No murmur heard.   No gallop.  Pulmonary:     Effort: Pulmonary effort is normal.     Breath sounds: Normal breath sounds. No wheezing, rhonchi or rales.  Musculoskeletal:     Cervical back: Normal range of motion and neck supple.  Lymphadenopathy:     Cervical: No cervical adenopathy.  Skin:    General: Skin is warm and dry.     Capillary Refill: Capillary refill takes less than 2 seconds.     Findings: No erythema or rash.  Neurological:     General: No focal deficit present.     Mental Status: She is alert and oriented to person, place, and time.  Psychiatric:        Mood and Affect: Mood normal.        Behavior: Behavior normal.        Thought Content: Thought content normal.        Judgment: Judgment normal.     UC Treatments / Results  Labs (all labs ordered are listed, but only abnormal results are displayed) Labs Reviewed  SARS CORONAVIRUS 2 (TAT 6-24 HRS)    EKG   Radiology No results found.  Procedures Procedures (including critical care time)  Medications Ordered in UC Medications - No data to display  Initial Impression / Assessment and Plan / UC Course  I have reviewed the triage vital signs and the nursing notes.  Pertinent labs & imaging results that were available during my care of the patient were reviewed by me and considered in my medical decision making (see chart for details).  Patient is a nontoxic-appearing 40 year old female here for a COVID test due to developing headache, fatigue, runny nose,  nasal congestion, nausea today.  She reports that this is very similar to her presentation from a year ago when she turned up COVID-positive.  She is here requesting testing.  On  physical exam she has pearly-gray tympanic membranes bilaterally with normal light reflex and clear external auditory canals.  Nasal mucosa is erythematous and edematous with clear nasal discharge in both nares.  Oropharyngeal exam reveals posterior oropharyngeal erythema with clear postnasal drip.  No injection.  No cervical lymphadenopathy appreciated exam.  Cardiopulmonary exam reveals clear lung sounds in all fields.  Will swab patient for COVID and discharged home to isolate pending the results.  I prescribed Atrovent nasal spray to help with nasal congestion.  She is not coughing at present and I have instructed her to use over-the-counter cough preparations should she develop any cough symptoms.  Work note provided.  Patient is currently 12 weeks postoperative from a myomectomy and oophorectomy.  If she test positive for COVID she does qualify for antiviral therapy.   Final Clinical Impressions(s) / UC Diagnoses   Final diagnoses:  Upper respiratory tract infection, unspecified type     Discharge Instructions      Isolate at home pending the results of your COVID test.  If you test positive then you will have to quarantine for 5 days from the start of your symptoms.  After 5 days you can break quarantine if your symptoms have improved and you have not had a fever for 24 hours without taking Tylenol or ibuprofen.  Use over-the-counter Tylenol and ibuprofen as needed for body aches and fever.  Use the Atrovent nasal spray, 2 squirts in each nostril every 6 hours, as needed for runny nose and postnasal drip.  Return for reevaluation or see your primary care provider for any new or worsening symptoms.  If you develop any increased shortness of breath-especially at rest, you are unable to speak in full sentences, or is  a late sign your lips are turning blue you need to go the ER for evaluation.      ED Prescriptions     Medication Sig Dispense Auth. Provider   ipratropium (ATROVENT) 0.06 % nasal spray Place 2 sprays into both nostrils 4 (four) times daily. 15 mL Margarette Canada, NP      PDMP not reviewed this encounter.   Margarette Canada, NP 07/29/21 2015

## 2021-07-30 LAB — SARS CORONAVIRUS 2 (TAT 6-24 HRS): SARS Coronavirus 2: NEGATIVE

## 2021-09-19 ENCOUNTER — Other Ambulatory Visit: Payer: Self-pay

## 2021-09-19 ENCOUNTER — Ambulatory Visit
Admission: EM | Admit: 2021-09-19 | Discharge: 2021-09-19 | Disposition: A | Discharge status: Home / Self Care | Payer: BC Managed Care – PPO

## 2021-09-19 DIAGNOSIS — R519 Headache, unspecified: Secondary | ICD-10-CM

## 2021-09-19 DIAGNOSIS — I10 Essential (primary) hypertension: Secondary | ICD-10-CM | POA: Diagnosis not present

## 2021-09-19 NOTE — ED Provider Notes (Signed)
[facility]    CSN: [id number] Arrival date & time: 09/19/21  1548      History   Chief Complaint Chief Complaint  Patient presents with   Headache    HPI Nicole [name] is a 41 y.o. female.   HPI  41 year old female here for evaluation of headache.  Patient reports that she woke up this morning and had a headache in her left frontal region.  The headache lasted for approximately 2 hours and then resolved.  She reports that she thinks her blood pressure was up because she has not taken her blood pressure medication since before her surgery.  She had a laparoscopic hysterectomy on 05/12/2021.  She denies any changes in vision, dizziness, chest pain, shortness of breath, nausea, vomiting, numbness, tingling, or weakness in any of her extremities.  Past Medical History:  Diagnosis Date   Anemia    COVID-19    Fibroid, uterine    Hypertension    Inguinal hernia    Menorrhagia    Ovarian cyst     There are no problems to display for this patient.   Past Surgical History:  Procedure Laterality Date   LAPAROSCOPIC BILATERAL SALPINGECTOMY Bilateral 05/12/2021   Procedure: LAPAROSCOPIC BILATERAL SALPINGECTOMY;  Surgeon: Schermerhorn, Gwen Her, MD;  Location: [facility];  Service: Gynecology;  Laterality: Bilateral;   LAPAROSCOPIC SUPRACERVICAL HYSTERECTOMY N/A 05/12/2021   Procedure: LAPAROSCOPIC SUPRACERVICAL HYSTERECTOMY;  Surgeon: Schermerhorn, Gwen Her, MD;  Location: [facility];  Service: Gynecology;  Laterality: N/A;    OB History   No obstetric history on file.      Home Medications    Prior to Admission medications   Medication Sig Start Date End Date Taking? Authorizing Provider  irbesartan (AVAPRO) 150 MG tablet Take 150 mg by mouth daily. 08/18/20  Yes [provider]  pantoprazole (PROTONIX) 40 MG tablet Take 1 tablet (40 mg total) by mouth daily. 11/11/20 11/15/20  Coral Spikes, DO    Family History Family History  Problem Relation Age of  Onset   Healthy Mother     Social History Social History   Tobacco Use   Smoking status: Every Day    Packs/day: 0.50    Types: Cigars, Cigarettes   Smokeless tobacco: Never  Vaping Use   Vaping Use: Never used  Substance Use Topics   Alcohol use: Yes    Comment: on weekends   Drug use: Never     Allergies   Hornet venom   Review of Systems Review of Systems  Eyes:  Negative for visual disturbance.  Respiratory:  Negative for shortness of breath.   Cardiovascular:  Negative for chest pain.  Gastrointestinal:  Negative for nausea and vomiting.  Neurological:  Positive for headaches. Negative for dizziness, syncope, speech difficulty, weakness and numbness.    Physical Exam Triage Vital Signs ED Triage Vitals  Enc Vitals Group     BP 09/19/21 1640 (!) 166/105     Pulse Rate 09/19/21 1640 73     Resp 09/19/21 1640 18     Temp 09/19/21 1640 98.7 F (37.1 C)     Temp Source 09/19/21 1640 Oral     SpO2 09/19/21 1640 100 %     Weight 09/19/21 1639 188 lb (85.3 kg)     Height 09/19/21 1639 5\' 6"  (1.676 m)     Head Circumference --      Peak Flow --      Pain Score 09/19/21 1638 7  Pain Loc --      Pain Edu? --      Excl. in [place]? --    No data found.  Updated Vital Signs BP (!) 166/105 (BP Location: Left Arm) Comment: Pt is supposed to be on BP meds but doesn't not take them.   Pulse 73    Temp 98.7 F (37.1 C) (Oral)    Resp 18    Ht 5\' 6"  (1.676 m)    Wt 188 lb (85.3 kg)    LMP 04/20/2021    SpO2 100%    BMI 30.34 kg/m   Visual Acuity Right Eye Distance:   Left Eye Distance:   Bilateral Distance:    Right Eye Near:   Left Eye Near:    Bilateral Near:     Physical Exam Vitals and nursing note reviewed.  Constitutional:      General: She is not in acute distress.    Appearance: Normal appearance. She is not ill-appearing.  HENT:     Head: Normocephalic and atraumatic.  Eyes:     General: No scleral icterus.    Extraocular Movements: Extraocular  movements intact.     Pupils: Pupils are equal, round, and reactive to light.  Cardiovascular:     Rate and Rhythm: Normal rate and regular rhythm.     Pulses: Normal pulses.     Heart sounds: Normal heart sounds. No murmur heard.   No friction rub. No gallop.  Pulmonary:     Effort: Pulmonary effort is normal.     Breath sounds: Normal breath sounds. No wheezing, rhonchi or rales.  Musculoskeletal:     Cervical back: Normal range of motion and neck supple.  Lymphadenopathy:     Cervical: No cervical adenopathy.  Skin:    General: Skin is warm and dry.     Capillary Refill: Capillary refill takes less than 2 seconds.     Findings: No erythema or rash.  Neurological:     General: No focal deficit present.     Mental Status: She is alert and oriented to person, place, and time.     Cranial Nerves: No cranial nerve deficit.     Motor: No weakness.  Psychiatric:        Mood and Affect: Mood normal.        Thought Content: Thought content normal.        Judgment: Judgment normal.     UC Treatments / Results  Labs (all labs ordered are listed, but only abnormal results are displayed) Labs Reviewed - No data to display  EKG   Radiology No results found.  Procedures Procedures (including critical care time)  Medications Ordered in UC Medications - No data to display  Initial Impression / Assessment and Plan / UC Course  I have reviewed the triage vital signs and the nursing notes.  Pertinent labs & imaging results that were available during my care of the patient were reviewed by me and considered in my medical decision making (see chart for details).  The patient is a nontoxic-appearing 41 year old female here for evaluation of headache, which is now resolved, that was present when she woke up this morning and lasted for approximately 2 to 3 hours.  She states that she got up and ate something and went back to bed and then the headache resolved.  She believes that her  headache was a result of her blood pressure being elevated.  She is currently 166/105 here in clinic.  She is  ordered irbesartan 150 mg daily but she has not taken it since before her surgery at the end of September.  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  I have advised her that these are all risk factors for high blood pressure and that she needs to resume taking her irbesartan as directed.  Her physical exam reveals pupils are equal round and reactive and EOMs intact.  Cranial nerves II through XII are grossly intact.  Cardiopulmonary exam reveals S1-S2 heart sounds without murmur, rub, or gallop.  Lung sounds are clear to auscultation all fields.  Patient is moving all extremities equally.  She is also not experiencing headache at this time.  I have encouraged her to resume taking her irbesartan as previously prescribed, stop smoking Black and milds cigars, and begin exercising.  I have also instructed her that if she has return of a headache, it worsens, it is associated with change in vision, slurred speech, nausea or vomiting, or chest pain that she should call 911 and go to the emergency department.  Work note provided.   Final Clinical Impressions(s) / UC Diagnoses   Final diagnoses:  Acute nonintractable headache, unspecified headache type  Essential hypertension     Discharge Instructions      Resume taking your blood pressure medication as prescribed.  Use OTC Tylenol and Ibuprofen as needed for headache.  If you develop a worsening headache, chest pain, dizziness, slurred speech, or changes in vision call 911 and go to the ER.      ED Prescriptions   None    PDMP not reviewed this encounter.   Margarette Canada, NP 09/19/21 1705

## 2021-09-19 NOTE — ED Triage Notes (Signed)
Pt here with C/O headache since waking up this morning. Thinks her BP is up.

## 2021-09-19 NOTE — Discharge Instructions (Signed)
Resume taking your blood pressure medication as prescribed.  Use OTC Tylenol and Ibuprofen as needed for headache.  If you develop a worsening headache, chest pain, dizziness, slurred speech, or changes in vision call 911 and go to the ER.

## 2021-10-31 ENCOUNTER — Other Ambulatory Visit: Payer: Self-pay

## 2021-10-31 ENCOUNTER — Ambulatory Visit
Admission: EM | Admit: 2021-10-31 | Discharge: 2021-10-31 | Disposition: A | Discharge status: Home / Self Care | Payer: BC Managed Care – PPO | Attending: Emergency Medicine | Admitting: Emergency Medicine

## 2021-10-31 DIAGNOSIS — J0140 Acute pansinusitis, unspecified: Secondary | ICD-10-CM

## 2021-10-31 DIAGNOSIS — K0889 Other specified disorders of teeth and supporting structures: Secondary | ICD-10-CM

## 2021-10-31 MED ORDER — AMOXICILLIN-POT CLAVULANATE 875-125 MG PO TABS: 1.0000 | ORAL_TABLET | Freq: Two times a day (BID) | ORAL | 0 refills | Status: AC

## 2021-10-31 NOTE — ED Provider Notes (Signed)
?Nicole [name] ? ? ? ?CSN: [id number] ?Arrival date & time: 10/31/21  1902 ? ? ?  ? ?History   ?Chief Complaint ?Chief Complaint  ?Patient presents with  ? Headache  ? Dental Pain  ? ? ?HPI ?Nicole [name] is a 41 y.o. female.  ? ?HPI ? ?38 old female here for evaluation of headache and dental pain. ? ?Patient ports that she has been experiencing pain in the front of her head for the last week.  This has been a congestion with nasal congestion and thick nasal discharge.  She denies any fever, sore throat, ear pain.  Her dental pain is in the bottom right molar that has a broken feeling.  She has been to her dentist and was treated with 1 round of antibiotics.  She was referred to an orthodontist and was told that she needs a root canal but she is hesitant to have the procedure at this time.  She has been avoiding eating on that side and she denies any drainage from her tooth. ? ?Past Medical History:  ?Diagnosis Date  ? Anemia   ? COVID-19   ? Fibroid, uterine   ? Hypertension   ? Inguinal hernia   ? Menorrhagia   ? Ovarian cyst   ? ? ?There are no problems to display for this patient. ? ? ?Past Surgical History:  ?Procedure Laterality Date  ? LAPAROSCOPIC BILATERAL SALPINGECTOMY Bilateral 05/12/2021  ? Procedure: LAPAROSCOPIC BILATERAL SALPINGECTOMY;  Surgeon: Schermerhorn, Gwen Her, MD;  Location: [facility];  Service: Gynecology;  Laterality: Bilateral;  ? LAPAROSCOPIC SUPRACERVICAL HYSTERECTOMY N/A 05/12/2021  ? Procedure: LAPAROSCOPIC SUPRACERVICAL HYSTERECTOMY;  Surgeon: Schermerhorn, Gwen Her, MD;  Location: [facility];  Service: Gynecology;  Laterality: N/A;  ? OVARIAN CYST SURGERY Left   ? ? ?OB History   ?No obstetric history on file. ?  ? ? ? ?Home Medications   ? ?Prior to Admission medications   ?Medication Sig Start Date End Date Taking? Authorizing Provider  ?amoxicillin-clavulanate (AUGMENTIN) 875-125 MG tablet Take 1 tablet by mouth every 12 (twelve) hours for 10 days. 10/31/21 11/10/21 Yes Margarette Canada, NP  ?irbesartan (AVAPRO) 150 MG tablet Take 150 mg by mouth daily. 08/18/20  Yes [provider]  ?pantoprazole (PROTONIX) 40 MG tablet Take 1 tablet (40 mg total) by mouth daily. 11/11/20 11/15/20  Coral Spikes, DO  ? ? ?Family History ?Family History  ?Problem Relation Age of Onset  ? Healthy Mother   ? ? ?Social History ?Social History  ? ?Tobacco Use  ? Smoking status: Every Day  ?  Packs/day: 0.50  ?  Types: Cigars, Cigarettes  ? Smokeless tobacco: Never  ?Vaping Use  ? Vaping Use: Never used  ?Substance Use Topics  ? Alcohol use: Yes  ?  Comment: on weekends  ? Drug use: Never  ? ? ? ?Allergies   ?Bee pollen and Hornet venom ? ? ?Review of Systems ?Review of Systems  ?Constitutional:  Negative for fever.  ?HENT:  Positive for congestion, dental problem, rhinorrhea and sinus pressure. Negative for ear pain and sore throat.   ?Respiratory:  Negative for cough.   ?Hematological: Negative.   ?Psychiatric/Behavioral: Negative.    ? ? ?Physical Exam ?Triage Vital Signs ?ED Triage Vitals  ?Enc Vitals Group  ?   BP --   ?   Pulse --   ?   Resp --   ?   Temp --   ?   Temp src --   ?  SpO2 --   ?   Weight 10/31/21 2010 188 lb (85.3 kg)  ?   Height 10/31/21 2010 $RemoveBefor'5\' 6"'ASIRkgbUeUws$  (1.676 m)  ?   Head Circumference --   ?   Peak Flow --   ?   Pain Score 10/31/21 2009 6  ?   Pain Loc --   ?   Pain Edu? --   ?   Excl. in [place]? --   ? ?No data found. ? ?Updated Vital Signs ?BP (!) 152/102 (BP Location: Left Arm)   Pulse 88   Temp 98.3 ?F (36.8 ?C) (Oral)   Resp 18   Ht $R'5\' 6"'sO$  (1.676 m)   Wt 188 lb (85.3 kg)   LMP 04/20/2021   SpO2 97%   BMI 30.34 kg/m?  ? ?Visual Acuity ?Right Eye Distance:   ?Left Eye Distance:   ?Bilateral Distance:   ? ?Right Eye Near:   ?Left Eye Near:    ?Bilateral Near:    ? ?Physical Exam ?Vitals and nursing note reviewed.  ?Constitutional:   ?   Appearance: Normal appearance. She is not ill-appearing.  ?HENT:  ?   Head: Normocephalic and atraumatic.  ?   Right Ear: Tympanic membrane, ear canal  and external ear normal. There is no impacted cerumen.  ?   Left Ear: Tympanic membrane, ear canal and external ear normal. There is no impacted cerumen.  ?   Nose: Congestion and rhinorrhea present.  ?   Mouth/Throat:  ?   Mouth: Mucous membranes are moist.  ?   Pharynx: Oropharynx is clear. No oropharyngeal exudate or posterior oropharyngeal erythema.  ?Cardiovascular:  ?   Rate and Rhythm: Normal rate and regular rhythm.  ?   Pulses: Normal pulses.  ?   Heart sounds: Normal heart sounds. No murmur heard. ?  No friction rub. No gallop.  ?Pulmonary:  ?   Effort: Pulmonary effort is normal.  ?   Breath sounds: Normal breath sounds. No wheezing, rhonchi or rales.  ?Musculoskeletal:  ?   Cervical back: Normal range of motion and neck supple.  ?Lymphadenopathy:  ?   Cervical: No cervical adenopathy.  ?Skin: ?   General: Skin is warm and dry.  ?   Capillary Refill: Capillary refill takes less than 2 seconds.  ?   Findings: No erythema or rash.  ?Neurological:  ?   General: No focal deficit present.  ?   Mental Status: She is alert and oriented to person, place, and time.  ?Psychiatric:     ?   Mood and Affect: Mood normal.     ?   Behavior: Behavior normal.     ?   Thought Content: Thought content normal.     ?   Judgment: Judgment normal.  ? ? ? ?UC Treatments / Results  ?Labs ?(all labs ordered are listed, but only abnormal results are displayed) ?Labs Reviewed - No data to display ? ?EKG ? ? ?Radiology ?No results found. ? ?Procedures ?Procedures (including critical care time) ? ?Medications Ordered in UC ?Medications - No data to display ? ?Initial Impression / Assessment and Plan / UC Course  ?I have reviewed the triage vital signs and the nursing notes. ? ?Pertinent labs & imaging results that were available during my care of the patient were reviewed by me and considered in my medical decision making (see chart for details). ? ?Patient is a nontoxic-appearing 41 year old female here for evaluation of 1 weeks  worth of frontal headache with nasal  congestion and thick nasal discharge.  No fever, sore throat, ear pain, or cough.  Physical exam reveals pearly-gray tympanic membranes bilaterally with normal light reflex and clear external auditory canals.  Nasal mucosa is markedly erythematous and edematous with thick yellow discharge in both nares.  Patient has tenderness to percussion of frontal and maxillary sinuses bilaterally.  Oropharyngeal exam reveals a broken right upper molar without any discharge.  There is no erythema or edema of the surrounding gum tissue noted.  There is no submandibular or submental fullness appreciated on exam.  Posterior oropharynx is unremarkable.  No cervical lymphadenopathy appreciated on exam.  Cardiopulmonary exam reveals clear lung sounds in all fields.  Patient's exam is consistent with pansinusitis.  I will place her on Augmentin twice daily for 10 days for treatment of sinus infection and have encouraged her to perform sinus irrigation.  We discussed that she will have recurrent pain in her tooth until after she has the root canal because her nerve is inflamed.  The Augmentin will cover her for potential infectious source brewing causing her dental pain.  I have also suggested that she use topical clove oil applied directly to the tooth to help with anesthesia and pain relief.  Work note provided. ? ? ?Final Clinical Impressions(s) / UC Diagnoses  ? ?Final diagnoses:  ?Acute non-recurrent pansinusitis  ?Pain, dental  ? ? ? ?Discharge Instructions   ? ?  ?The Augmentin twice daily with food for 10 days for treatment of your sinusitis. ? ?Perform sinus irrigation 2-3 times a day with a NeilMed sinus rinse kit and distilled water.  Do not use tap water. ? ?You can use plain over-the-counter Mucinex every 6 hours to break up the stickiness of the mucus so your body can clear it. ? ?Increase your oral fluid intake to thin out your mucus so that is also able for your body to clear more  easily. ? ?Take an over-the-counter probiotic, such as Culturelle-align-activia, 1 hour after each dose of antibiotic to prevent diarrhea. ? ?Apply clove oil to your tooth to help with the nerve pain. ? ?If you dev

## 2021-10-31 NOTE — ED Triage Notes (Signed)
Pt reports headache intermittently for one week, has been taken OTC Tylenol for pain ,seen in Feb for same.  ? ?Pt also reports dental pain to right bottom molar, has seen dentist, referred to orthodontist, needs root canal and hesitant to have procedure at this time.  ?

## 2021-10-31 NOTE — Discharge Instructions (Signed)
The Augmentin twice daily with food for 10 days for treatment of your sinusitis. ? ?Perform sinus irrigation 2-3 times a day with a NeilMed sinus rinse kit and distilled water.  Do not use tap water. ? ?You can use plain over-the-counter Mucinex every 6 hours to break up the stickiness of the mucus so your body can clear it. ? ?Increase your oral fluid intake to thin out your mucus so that is also able for your body to clear more easily. ? ?Take an over-the-counter probiotic, such as Culturelle-align-activia, 1 hour after each dose of antibiotic to prevent diarrhea. ? ?Apply clove oil to your tooth to help with the nerve pain. ? ?If you develop any new or worsening symptoms return for reevaluation or see your primary care provider.  ?

## 2021-11-03 ENCOUNTER — Telehealth: Payer: Self-pay | Admitting: Physician Assistant

## 2021-11-03 DIAGNOSIS — R110 Nausea: Secondary | ICD-10-CM

## 2021-11-03 MED ORDER — ONDANSETRON HCL 4 MG PO TABS: 4.0000 mg | ORAL_TABLET | Freq: Two times a day (BID) | ORAL | 0 refills | Status: AC | PRN

## 2021-11-03 NOTE — Telephone Encounter (Signed)
Patient reporting nausea with the amoxicillin.  Offered a different antibiotic or Zofran.  Patient wants to continue amoxicillin and try the antiemetic.  Sent Zofran to pharmacy. ?

## 2021-11-29 ENCOUNTER — Ambulatory Visit
Admission: EM | Admit: 2021-11-29 | Discharge: 2021-11-29 | Disposition: A | Discharge status: Home / Self Care | Payer: BC Managed Care – PPO | Attending: Family | Admitting: Family

## 2021-11-29 DIAGNOSIS — J029 Acute pharyngitis, unspecified: Secondary | ICD-10-CM | POA: Diagnosis not present

## 2021-11-29 DIAGNOSIS — J301 Allergic rhinitis due to pollen: Secondary | ICD-10-CM

## 2021-11-29 DIAGNOSIS — R0981 Nasal congestion: Secondary | ICD-10-CM

## 2021-11-29 DIAGNOSIS — I10 Essential (primary) hypertension: Secondary | ICD-10-CM

## 2021-11-29 MED ORDER — MONTELUKAST SODIUM 10 MG PO TABS: 10.0000 mg | ORAL_TABLET | Freq: Every day | ORAL | 2 refills | Status: DC

## 2021-11-29 MED ORDER — FLUTICASONE PROPIONATE 50 MCG/ACT NA SUSP: 1.0000 | Freq: Two times a day (BID) | NASAL | 1 refills | Status: AC

## 2021-11-29 NOTE — ED Triage Notes (Signed)
Patient presents to Urgent Care with complaints of sore throat and congestion since last night. Pt states symptoms worse today . Has a hx of allergies. Treating with OTC allergy meds no improvement.  ?

## 2021-11-29 NOTE — ED Provider Notes (Signed)
?[place] ? ? ? ?CSN: [id number] ?Arrival date & time: 11/29/21  1752 ? ? ?  ? ?History   ?Chief Complaint ?Chief Complaint  ?Patient presents with  ? Sore Throat  ? Nasal Congestion  ? ? ?HPI ?Nicole [name] is a 41 y.o. female.  ? ?41 year old female presents with sore throat, nasal congestion, sneezing and sinus pressure that started again yesterday and is worse today. She has had intermittent nasal allergy symptoms for the past few weeks. They improved with use of OTC [employer] allergy medication (?generic Benadryl) but now allergy-like symptoms returned yesterday and are worse today. Pollen counts are very high right now. Denies any fever, coughing or GI symptoms. Has used Zyrtec in the past with minimal relief. Does have Astepro at home but has not used it lately. Chronic health issues include HTN and seasonal allergies. Currently on Avapro daily.  ? ?The history is provided by the patient.  ? ?Past Medical History:  ?Diagnosis Date  ? Anemia   ? COVID-19   ? Fibroid, uterine   ? Hypertension   ? Inguinal hernia   ? Menorrhagia   ? Ovarian cyst   ? ? ?There are no problems to display for this patient. ? ? ?Past Surgical History:  ?Procedure Laterality Date  ? LAPAROSCOPIC BILATERAL SALPINGECTOMY Bilateral 05/12/2021  ? Procedure: LAPAROSCOPIC BILATERAL SALPINGECTOMY;  Surgeon: Schermerhorn, Gwen Her, MD;  Location: [facility];  Service: Gynecology;  Laterality: Bilateral;  ? LAPAROSCOPIC SUPRACERVICAL HYSTERECTOMY N/A 05/12/2021  ? Procedure: LAPAROSCOPIC SUPRACERVICAL HYSTERECTOMY;  Surgeon: Schermerhorn, Gwen Her, MD;  Location: [facility];  Service: Gynecology;  Laterality: N/A;  ? OVARIAN CYST SURGERY Left   ? ? ?OB History   ?No obstetric history on file. ?  ? ? ? ?Home Medications   ? ?Prior to Admission medications   ?Medication Sig Start Date End Date Taking? Authorizing Provider  ?fluticasone (FLONASE) 50 MCG/ACT nasal spray Place 1 spray into both nostrils 2 (two) times daily. 11/29/21  Yes  Arneshia Ade, Nicholes Stairs, NP  ?montelukast (SINGULAIR) 10 MG tablet Take 1 tablet (10 mg total) by mouth at bedtime. 11/29/21  Yes Yeraldin Litzenberger, Nicholes Stairs, NP  ?irbesartan (AVAPRO) 150 MG tablet Take 150 mg by mouth daily. 08/18/20   [provider]  ?pantoprazole (PROTONIX) 40 MG tablet Take 1 tablet (40 mg total) by mouth daily. 11/11/20 11/15/20  Coral Spikes, DO  ? ? ?Family History ?Family History  ?Problem Relation Age of Onset  ? Healthy Mother   ? ? ?Social History ?Social History  ? ?Tobacco Use  ? Smoking status: Every Day  ?  Packs/day: 0.50  ?  Types: Cigars, Cigarettes  ? Smokeless tobacco: Never  ?Vaping Use  ? Vaping Use: Never used  ?Substance Use Topics  ? Alcohol use: Yes  ?  Comment: on weekends  ? Drug use: Never  ? ? ? ?Allergies   ?Bee pollen and Hornet venom ? ? ?Review of Systems ?Review of Systems  ?Constitutional:  Positive for fatigue. Negative for activity change, appetite change, chills, diaphoresis and fever.  ?HENT:  Positive for congestion, postnasal drip, rhinorrhea, sinus pressure, sneezing and sore throat. Negative for ear discharge, ear pain, mouth sores, sinus pain and trouble swallowing.   ?Eyes:  Negative for discharge, redness and itching.  ?Respiratory:  Negative for cough, chest tightness, shortness of breath and wheezing.   ?Gastrointestinal:  Negative for nausea and vomiting.  ?Musculoskeletal:  Negative for arthralgias, myalgias, neck pain and neck  stiffness.  ?Skin:  Negative for color change and rash.  ?Allergic/Immunologic: Positive for environmental allergies. Negative for food allergies and immunocompromised state.  ?Neurological:  Positive for headaches. Negative for dizziness, tremors, seizures, syncope, speech difficulty, light-headedness and numbness.  ?Hematological:  Negative for adenopathy. Does not bruise/bleed easily.  ? ? ?Physical Exam ?Triage Vital Signs ?ED Triage Vitals [11/29/21 1800]  ?Enc Vitals Group  ?   BP (!) 156/104  ?   Pulse Rate 94  ?   Resp 16  ?    Temp 99.1 ?F (37.3 ?C)  ?   Temp Source Oral  ?   SpO2 98 %  ?   Weight   ?   Height   ?   Head Circumference   ?   Peak Flow   ?   Pain Score 0  ?   Pain Loc   ?   Pain Edu?   ?   Excl. in [place]?   ? ?No data found. ? ?Updated Vital Signs ?BP (!) 156/104 (BP Location: Left Arm)   Pulse 94   Temp 99.1 ?F (37.3 ?C) (Oral)   Resp 16   LMP 04/20/2021   SpO2 98%  ? ?Visual Acuity ?Right Eye Distance:   ?Left Eye Distance:   ?Bilateral Distance:   ? ?Right Eye Near:   ?Left Eye Near:    ?Bilateral Near:    ? ?Physical Exam ?Vitals and nursing note reviewed.  ?Constitutional:   ?   General: She is awake. She is not in acute distress. ?   Appearance: She is well-developed and well-groomed.  ?   Comments: She is sitting on the exam table in no acute distress but appears tired.   ?HENT:  ?   Head: Normocephalic and atraumatic.  ?   Right Ear: Hearing, tympanic membrane, ear canal and external ear normal.  ?   Left Ear: Hearing, tympanic membrane, ear canal and external ear normal.  ?   Ears:  ?   Comments: Cerumen in left ear canal- not obstructing canal ?   Nose: Congestion present.  ?   Right Sinus: No maxillary sinus tenderness or frontal sinus tenderness.  ?   Left Sinus: No maxillary sinus tenderness or frontal sinus tenderness.  ?   Mouth/Throat:  ?   Lips: Pink.  ?   Mouth: Mucous membranes are moist.  ?   Pharynx: Uvula midline. Oropharyngeal exudate and posterior oropharyngeal erythema present. No pharyngeal swelling or uvula swelling.  ?   Comments: Slight clear post nasal drainage present ?Eyes:  ?   Extraocular Movements: Extraocular movements intact.  ?   Conjunctiva/sclera: Conjunctivae normal.  ?Cardiovascular:  ?   Rate and Rhythm: Normal rate and regular rhythm.  ?   Heart sounds: Normal heart sounds. No murmur heard. ?Pulmonary:  ?   Effort: Pulmonary effort is normal. No respiratory distress.  ?   Breath sounds: Normal breath sounds and air entry. No decreased air movement. No decreased breath sounds,  wheezing, rhonchi or rales.  ?Musculoskeletal:  ?   Cervical back: Normal range of motion and neck supple.  ?Lymphadenopathy:  ?   Cervical: No cervical adenopathy.  ?Skin: ?   General: Skin is warm and dry.  ?   Findings: No rash.  ?Neurological:  ?   General: No focal deficit present.  ?   Mental Status: She is alert and oriented to person, place, and time.  ?Psychiatric:     ?   Mood and  Affect: Mood normal.     ?   Behavior: Behavior normal. Behavior is cooperative.     ?   Thought Content: Thought content normal.     ?   Judgment: Judgment normal.  ? ? ? ?UC Treatments / Results  ?Labs ?(all labs ordered are listed, but only abnormal results are displayed) ?Labs Reviewed - No data to display ? ?EKG ? ? ?Radiology ?No results found. ? ?Procedures ?Procedures (including critical care time) ? ?Medications Ordered in UC ?Medications - No data to display ? ?Initial Impression / Assessment and Plan / UC Course  ?I have reviewed the triage vital signs and the nursing notes. ? ?Pertinent labs & imaging results that were available during my care of the patient were reviewed by me and considered in my medical decision making (see chart for details). ? ?  ? ?Reviewed with patient that she appears to have a flare-up of her seasonal allergies. Recommend start Flonase 1 spray each nostril twice a day and then may decrease to once daily after 5 to 7 days. Since her blood pressure is elevated, discussed that she should not take any oral decongestants. May use OTC Afrin every 12 hours as needed for 3 days only to help with congestion to give time for Flonase to become more effective. May also trial Singulair '10mg'$  daily at bedtime. Continue to push fluids to help loosen up mucus in sinuses. May continue OTC Ibuprofen as needed for headache but also discussed this medication can raise BP. Patient is mainly taking Ibuprofen currently for tooth pain in which she is going to the dentist tomorrow. Note written for work. Follow-up in  3 to 4 days if not improving.  ?Final Clinical Impressions(s) / UC Diagnoses  ? ?Final diagnoses:  ?Seasonal allergic rhinitis due to pollen  ?Sore throat  ?Nasal congestion  ?Elevated blood pressure reading with

## 2021-11-29 NOTE — Discharge Instructions (Addendum)
Recommend start Flonase 1 spray each nostril twice a day for the next 5 to 7 days then may decrease to 1 spray each nostril daily. While starting Flonase, may use OTC Afrin every 12 hours as directed for 3 days only then stop to help with congestion. May also start Singulair '10mg'$  daily at night. Continue to push fluids to help loosen up mucus in sinuses. May continue Ibuprofen as needed for pain/headache. Follow-up in 3 to 4 days if not improving.  ?

## 2021-12-26 ENCOUNTER — Ambulatory Visit
Admission: EM | Admit: 2021-12-26 | Discharge: 2021-12-26 | Disposition: A | Discharge status: Home / Self Care | Payer: BC Managed Care – PPO

## 2021-12-26 DIAGNOSIS — R1012 Left upper quadrant pain: Secondary | ICD-10-CM

## 2021-12-26 DIAGNOSIS — R109 Unspecified abdominal pain: Secondary | ICD-10-CM

## 2021-12-26 DIAGNOSIS — R140 Abdominal distension (gaseous): Secondary | ICD-10-CM | POA: Diagnosis not present

## 2021-12-26 DIAGNOSIS — N920 Excessive and frequent menstruation with regular cycle: Secondary | ICD-10-CM | POA: Insufficient documentation

## 2021-12-26 MED ORDER — PANTOPRAZOLE SODIUM 40 MG PO TBEC: 40.0000 mg | DELAYED_RELEASE_TABLET | Freq: Two times a day (BID) | ORAL | 2 refills | Status: AC

## 2021-12-26 NOTE — Discharge Instructions (Signed)
Recommend start Protonix '40mg'$  twice a day for 1 to 2 weeks then decrease to 1 tablet daily. Recommend eat small frequent meals and limit fried and fatty foods. May need to see a GI specialist if symptoms do not improve. If pain worsens, go to the ER ASAP. Otherwise, follow-up with your PCP or GI specialist as recommended.  ?

## 2021-12-26 NOTE — ED Triage Notes (Signed)
Patient is here "excessive gas, cramping, under left ribs hurting a lot". "Very gassy'. No nausea. No vomiting. Stools "normal" (not loose or hard). Significant history of GERD.  ?

## 2021-12-27 NOTE — ED Provider Notes (Signed)
?[place] ? ? ? ?CSN: [id number] ?Arrival date & time: 12/26/21  1751 ? ? ?  ? ?History   ?Chief Complaint ?Chief Complaint  ?Patient presents with  ? Bloated  ? ? ?HPI ?Nicole [name] is a 41 y.o. female.  ? ?41 year old female presents with left upper abdominal discomfort, cramping and increase in intestinal gas that started yesterday. Pain will occasionally travel to upper chest. Denies any fever, nausea, vomiting or diarrhea. Usual stools/no constipation. No dizziness, vision changes or palpitations. Having difficulty sleeping due to occasional pain. Eating and drinking as usual. Had a burger from [employer] earlier today. Took OTC Prilosec yesterday and today with minimal relief. Has history of GERD/Heartburn and usually Prilosec is helpful. Missed work today due to pain and requests note. Also has history of seasonal allergies, currently on Singulair and Flonase with some relief. Also has history of HTN. Currently on Avapro daily.  ? ?The history is provided by the patient.  ? ?Past Medical History:  ?Diagnosis Date  ? Anemia   ? COVID-19   ? Fibroid, uterine   ? Hypertension   ? Inguinal hernia   ? Menorrhagia   ? Ovarian cyst   ? ? ?Patient Active Problem List  ? Diagnosis Date Noted  ? Menorrhagia with regular cycle 12/26/2021  ? ? ?Past Surgical History:  ?Procedure Laterality Date  ? LAPAROSCOPIC BILATERAL SALPINGECTOMY Bilateral 05/12/2021  ? Procedure: LAPAROSCOPIC BILATERAL SALPINGECTOMY;  Surgeon: Schermerhorn, Gwen Her, MD;  Location: [facility];  Service: Gynecology;  Laterality: Bilateral;  ? LAPAROSCOPIC SUPRACERVICAL HYSTERECTOMY N/A 05/12/2021  ? Procedure: LAPAROSCOPIC SUPRACERVICAL HYSTERECTOMY;  Surgeon: Schermerhorn, Gwen Her, MD;  Location: [facility];  Service: Gynecology;  Laterality: N/A;  ? OVARIAN CYST SURGERY Left   ? ? ?OB History   ?No obstetric history on file. ?  ? ? ? ?Home Medications   ? ?Prior to Admission medications   ?Medication Sig Start Date End Date Taking?  Authorizing Provider  ?ibuprofen (ADVIL) 800 MG tablet Take by mouth. 05/12/21  Yes [provider]  ?irbesartan (AVAPRO) 300 MG tablet Take 300 mg by mouth daily. 12/14/21  Yes [provider]  ?pantoprazole (PROTONIX) 40 MG tablet Take 1 tablet (40 mg total) by mouth 2 (two) times daily before a meal. For up to 2 weeks then once daily 12/26/21  Yes Augustina Braddock, Nicholes Stairs, NP  ?fluticasone (FLONASE) 50 MCG/ACT nasal spray Place 1 spray into both nostrils 2 (two) times daily. 11/29/21   Katy Apo, NP  ?montelukast (SINGULAIR) 10 MG tablet Take 1 tablet (10 mg total) by mouth at bedtime. 11/29/21   Katy Apo, NP  ? ? ?Family History ?Family History  ?Problem Relation Age of Onset  ? Healthy Mother   ? ? ?Social History ?Social History  ? ?Tobacco Use  ? Smoking status: Every Day  ?  Packs/day: 0.50  ?  Types: Cigars, Cigarettes  ? Smokeless tobacco: Never  ?Vaping Use  ? Vaping Use: Never used  ?Substance Use Topics  ? Alcohol use: Yes  ?  Comment: on weekends  ? Drug use: Never  ? ? ? ?Allergies   ?Bee pollen and Hornet venom ? ? ?Review of Systems ?Review of Systems  ?Constitutional:  Negative for activity change, appetite change, chills, diaphoresis, fatigue and fever.  ?HENT:  Positive for congestion, postnasal drip and rhinorrhea. Negative for mouth sores, sinus pain, sore throat and trouble swallowing.   ?Eyes:  Negative for photophobia, pain, discharge, redness,  itching and visual disturbance.  ?Respiratory:  Negative for cough, chest tightness, shortness of breath and wheezing.   ?Cardiovascular:  Negative for palpitations.  ?Gastrointestinal:  Positive for abdominal pain. Negative for anal bleeding, blood in stool, constipation, diarrhea, nausea and vomiting.  ?Genitourinary:  Negative for decreased urine volume, difficulty urinating and dysuria.  ?Musculoskeletal:  Negative for neck pain and neck stiffness.  ?Skin:  Negative for color change and rash.  ?Allergic/Immunologic: Positive for  environmental allergies. Negative for food allergies and immunocompromised state.  ?Neurological:  Negative for dizziness, tremors, seizures, syncope, facial asymmetry, speech difficulty, light-headedness and numbness.  ?Hematological:  Negative for adenopathy. Does not bruise/bleed easily.  ? ? ?Physical Exam ?Triage Vital Signs ?ED Triage Vitals  ?Enc Vitals Group  ?   BP 12/26/21 1826 (!) 154/95  ?   Pulse Rate 12/26/21 1826 79  ?   Resp 12/26/21 1826 18  ?   Temp 12/26/21 1826 98.3 ?F (36.8 ?C)  ?   Temp Source 12/26/21 1826 Oral  ?   SpO2 12/26/21 1826 99 %  ?   Weight 12/26/21 1824 207 lb (93.9 kg)  ?   Height 12/26/21 1824 '5\' 6"'$  (1.676 m)  ?   Head Circumference --   ?   Peak Flow --   ?   Pain Score 12/26/21 1821 6  ?   Pain Loc --   ?   Pain Edu? --   ?   Excl. in [place]? --   ? ?No data found. ? ?Updated Vital Signs ?BP (!) 154/95 (BP Location: Left Arm)   Pulse 79   Temp 98.3 ?F (36.8 ?C) (Oral)   Resp 18   Ht '5\' 6"'$  (1.676 m)   Wt 207 lb (93.9 kg)   LMP 04/20/2021   SpO2 99%   BMI 33.41 kg/m?  ? ?Visual Acuity ?Right Eye Distance:   ?Left Eye Distance:   ?Bilateral Distance:   ? ?Right Eye Near:   ?Left Eye Near:    ?Bilateral Near:    ? ?Physical Exam ?Vitals and nursing note reviewed.  ?Constitutional:   ?   General: She is awake. She is not in acute distress. ?   Appearance: She is well-developed and well-groomed.  ?   Comments: She is sitting comfortably on the exam table in no acute distress but appears tired.   ?HENT:  ?   Head: Normocephalic and atraumatic.  ?   Right Ear: Hearing normal.  ?   Left Ear: Hearing normal.  ?   Nose: Mucosal edema and congestion present.  ?   Right Sinus: No maxillary sinus tenderness or frontal sinus tenderness.  ?   Left Sinus: No maxillary sinus tenderness or frontal sinus tenderness.  ?   Mouth/Throat:  ?   Lips: Pink.  ?   Mouth: Mucous membranes are moist.  ?   Pharynx: Oropharynx is clear. Uvula midline. No pharyngeal swelling, oropharyngeal exudate,  posterior oropharyngeal erythema or uvula swelling.  ?Eyes:  ?   Extraocular Movements: Extraocular movements intact.  ?   Conjunctiva/sclera: Conjunctivae normal.  ?Cardiovascular:  ?   Rate and Rhythm: Normal rate and regular rhythm.  ?   Heart sounds: Normal heart sounds. No murmur heard. ?Pulmonary:  ?   Effort: Pulmonary effort is normal. No prolonged expiration or respiratory distress.  ?   Breath sounds: Normal breath sounds and air entry. No decreased air movement. No decreased breath sounds, wheezing, rhonchi or rales.  ?Abdominal:  ?  General: Bowel sounds are normal. There is no distension.  ?   Palpations: Abdomen is soft. There is no hepatomegaly or splenomegaly.  ?   Tenderness: There is abdominal tenderness in the left upper quadrant. There is no right CVA tenderness or left CVA tenderness.  ?Musculoskeletal:     ?   General: Normal range of motion.  ?   Cervical back: Normal range of motion and neck supple.  ?Skin: ?   General: Skin is warm and dry.  ?   Capillary Refill: Capillary refill takes less than 2 seconds.  ?   Findings: No rash.  ?Neurological:  ?   General: No focal deficit present.  ?   Mental Status: She is alert and oriented to person, place, and time.  ?Psychiatric:     ?   Mood and Affect: Mood normal.     ?   Behavior: Behavior normal. Behavior is cooperative.     ?   Thought Content: Thought content normal.  ? ? ? ?UC Treatments / Results  ?Labs ?(all labs ordered are listed, but only abnormal results are displayed) ?Labs Reviewed - No data to display ? ?EKG ? ? ?Radiology ?No results found. ? ?Procedures ?Procedures (including critical care time) ? ?Medications Ordered in UC ?Medications - No data to display ? ?Initial Impression / Assessment and Plan / UC Course  ?I have reviewed the triage vital signs and the nursing notes. ? ?Pertinent labs & imaging results that were available during my care of the patient were reviewed by me and considered in my medical decision making (see  chart for details). ? ?  ? ?Patient's pain has greatly improved- only minimal intermittent left upper abdominal pain now. Doubt cardiac or pulmonology etiology. Discussed that this may be GERD but she should have furth

## 2022-01-23 ENCOUNTER — Ambulatory Visit
Admission: EM | Admit: 2022-01-23 | Discharge: 2022-01-23 | Disposition: A | Discharge status: Home / Self Care | Payer: BC Managed Care – PPO

## 2022-01-23 DIAGNOSIS — Z0289 Encounter for other administrative examinations: Secondary | ICD-10-CM

## 2022-01-23 DIAGNOSIS — Z0130 Encounter for examination of blood pressure without abnormal findings: Secondary | ICD-10-CM | POA: Diagnosis not present

## 2022-01-23 NOTE — ED Provider Notes (Signed)
[facility]    CSN: [id number] Arrival date & time: 01/23/22  1756      History   Chief Complaint Chief Complaint  Patient presents with   Hypertension    HPI Nicole [name] is a 41 y.o. female presenting for blood pressure check and work note.  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  This passed quickly, and she is feeling well at time of visit.  She states she needs a work note for at least 2 days.  She denies current chest pain, shortness of breath, dizziness, weakness, headaches, vision changes.  She does not have a blood pressure cuff at home, and does not check her blood pressure regularly.  She is taking irbesartan 300 mg daily, which was increased from 150 mg one month ago by PCP.   HPI  Past Medical History:  Diagnosis Date   Anemia    COVID-19    Fibroid, uterine    Hypertension    Inguinal hernia    Menorrhagia    Ovarian cyst     Patient Active Problem List   Diagnosis Date Noted   Menorrhagia with regular cycle 12/26/2021    Past Surgical History:  Procedure Laterality Date   LAPAROSCOPIC BILATERAL SALPINGECTOMY Bilateral 05/12/2021   Procedure: LAPAROSCOPIC BILATERAL SALPINGECTOMY;  Surgeon: Schermerhorn, Gwen Her, MD;  Location: [facility];  Service: Gynecology;  Laterality: Bilateral;   LAPAROSCOPIC SUPRACERVICAL HYSTERECTOMY N/A 05/12/2021   Procedure: LAPAROSCOPIC SUPRACERVICAL HYSTERECTOMY;  Surgeon: Schermerhorn, Gwen Her, MD;  Location: [facility];  Service: Gynecology;  Laterality: N/A;   OVARIAN CYST SURGERY Left     OB History   No obstetric history on file.      Home Medications    Prior to Admission medications   Medication Sig Start Date End Date Taking? Authorizing Provider  fluticasone (FLONASE) 50 MCG/ACT nasal spray Place 1 spray into both nostrils 2 (two)  times daily. 11/29/21   Katy Apo, NP  ibuprofen (ADVIL) 800 MG tablet Take by mouth. 05/12/21   [provider]  irbesartan (AVAPRO) 300 MG tablet Take 300 mg by mouth daily. 12/14/21   [provider]  montelukast (SINGULAIR) 10 MG tablet Take 1 tablet (10 mg total) by mouth at bedtime. 11/29/21   Katy Apo, NP  pantoprazole (PROTONIX) 40 MG tablet Take 1 tablet (40 mg total) by mouth 2 (two) times daily before a meal. For up to 2 weeks then once daily 12/26/21   Amyot, Nicholes Stairs, NP    Family History Family History  Problem Relation Age of Onset   Healthy Mother     Social History Social History   Tobacco Use   Smoking status: Every Day    Packs/day: 0.50    Types: Cigars, Cigarettes   Smokeless tobacco: Never  Vaping Use   Vaping Use: Never used  Substance Use Topics   Alcohol use: Yes    Comment: on weekends   Drug use: Never     Allergies   Bee pollen and Hornet venom   Review of Systems Review of Systems  Neurological:  Negative for dizziness.  All other systems reviewed and are negative.    Physical Exam Triage Vital Signs ED Triage Vitals  Enc Vitals Group     BP 01/23/22 1810 (!) 141/81  Pulse Rate 01/23/22 1812 82     Resp 01/23/22 1810 16     Temp 01/23/22 1812 98.1 F (36.7 C)     Temp Source 01/23/22 1812 Oral     SpO2 01/23/22 1810 98 %     Weight --      Height --      Head Circumference --      Peak Flow --      Pain Score --      Pain Loc --      Pain Edu? --      Excl. in [place]? --    No data found.  Updated Vital Signs BP (!) 141/81 (BP Location: Left Arm)   Pulse 82   Temp 98.1 F (36.7 C) (Oral)   Resp 16   LMP 04/20/2021   SpO2 98%   Visual Acuity Right Eye Distance:   Left Eye Distance:   Bilateral Distance:    Right Eye Near:   Left Eye Near:    Bilateral Near:     Physical Exam Vitals reviewed.  Constitutional:      Appearance: Normal appearance. She is not diaphoretic.  HENT:      Head: Normocephalic and atraumatic.     Mouth/Throat:     Mouth: Mucous membranes are moist.  Eyes:     Extraocular Movements: Extraocular movements intact.     Pupils: Pupils are equal, round, and reactive to light.  Cardiovascular:     Rate and Rhythm: Normal rate and regular rhythm.     Pulses:          Radial pulses are 2+ on the right side and 2+ on the left side.     Heart sounds: Normal heart sounds.  Pulmonary:     Effort: Pulmonary effort is normal.     Breath sounds: Normal breath sounds.  Abdominal:     Palpations: Abdomen is soft.     Tenderness: There is no abdominal tenderness. There is no guarding or rebound.  Musculoskeletal:     Right lower leg: No edema.     Left lower leg: No edema.  Skin:    General: Skin is warm.     Capillary Refill: Capillary refill takes less than 2 seconds.  Neurological:     General: No focal deficit present.     Mental Status: She is alert and oriented to person, place, and time.  Psychiatric:        Mood and Affect: Mood normal.        Behavior: Behavior normal.        Thought Content: Thought content normal.        Judgment: Judgment normal.      UC Treatments / Results  Labs (all labs ordered are listed, but only abnormal results are displayed) Labs Reviewed - No data to display  EKG   Radiology No results found.  Procedures Procedures (including critical care time)  Medications Ordered in UC Medications - No data to display  Initial Impression / Assessment and Plan / UC Course  I have reviewed the triage vital signs and the nursing notes.  Pertinent labs & imaging results that were available during my care of the patient were reviewed by me and considered in my medical decision making (see chart for details).     This patient is a very pleasant 41 y.o. year old female presenting with BP check and for work note.  At time of visit, her blood pressure is 141/81,  and she is asymptomatic-denies headaches, dizziness,  vision changes, chest pain, shortness of breath.  She does not monitor her blood pressure at home, so I encouraged this.  She is currently taking irbesartan 300 mg daily as prescribed by PCP, continue this for now.  Work note provided for 2 days. ED return precautions discussed. Patient verbalizes understanding and agreement.   Final Clinical Impressions(s) / UC Diagnoses   Final diagnoses:  Blood pressure check  Encounter to obtain excuse from work     Discharge Instructions      -Continue the irbesartan '300mg'$  once daily as directed -Please check your blood pressure at home or at the pharmacy. If this continues to be >140/90, follow-up with your primary care provider for further blood pressure management/ medication titration. If you develop chest pain, shortness of breath, vision changes, the worst headache of your life- head straight to the ED or call 911.    ED Prescriptions   None    PDMP not reviewed this encounter.   Hazel Sams, PA-C 01/23/22 1839

## 2022-01-23 NOTE — Discharge Instructions (Addendum)
-  Continue the irbesartan '300mg'$  once daily as directed -Please check your blood pressure at home or at the pharmacy. If this continues to be >140/90, follow-up with your primary care provider for further blood pressure management/ medication titration. If you develop chest pain, shortness of breath, vision changes, the worst headache of your life- head straight to the ED or call 911.

## 2022-01-23 NOTE — ED Triage Notes (Signed)
Pt reports blood pressure is high. She does not have a blood pressure monitor at home. Pt reports a dose increase of Irbesartan  1 month ago.

## 2022-03-20 ENCOUNTER — Ambulatory Visit
Admission: EM | Admit: 2022-03-20 | Discharge: 2022-03-20 | Disposition: A | Discharge status: Home / Self Care | Payer: BC Managed Care – PPO | Attending: Emergency Medicine | Admitting: Emergency Medicine

## 2022-03-20 DIAGNOSIS — I10 Essential (primary) hypertension: Secondary | ICD-10-CM | POA: Insufficient documentation

## 2022-03-20 LAB — BASIC METABOLIC PANEL
Anion gap: 4 — ABNORMAL LOW (ref 5–15)
BUN: 9 mg/dL (ref 6–20)
CO2: 23 mmol/L (ref 22–32)
Calcium: 8.5 mg/dL — ABNORMAL LOW (ref 8.9–10.3)
Chloride: 108 mmol/L (ref 98–111)
Creatinine, Ser: 0.64 mg/dL (ref 0.44–1.00)
GFR, Estimated: >60 mL/min (ref >60)
Glucose, Bld: 89 mg/dL (ref 70–99)
Potassium: 3.6 mmol/L (ref 3.5–5.1)
Sodium: 135 mmol/L (ref 135–145)

## 2022-03-20 MED ORDER — HYDROCHLOROTHIAZIDE 25 MG PO TABS: 25.0000 mg | ORAL_TABLET | Freq: Every day | ORAL | 1 refills | Status: DC

## 2022-03-20 NOTE — ED Provider Notes (Signed)
[facility]    CSN: [id number] Arrival date & time: 03/20/22  1923      History   Chief Complaint Chief Complaint  Patient presents with   evelated BP    HPI Nicole [name] is a 41 y.o. female.   HPI  41 year old female here for evaluation of headache and elevated blood pressure.  Patient reports that she has been experiencing a headache for the last 2 days and she states that when she gets a headache this is how she knows that her blood pressure is up.  She denies any chest pain, changes in vision, nausea, or vomiting.  She does have a family history of high blood pressure, she is a smoker, and she is a social drinker.  She states that she had experienced high blood pressure until she started work at the [employer], where she currently still works, and she reports that she is currently taking her irbesartan and has not missed any doses.  Her doctor recently increased her dose to 300 mg.  Past Medical History:  Diagnosis Date   Anemia    COVID-19    Fibroid, uterine    Hypertension    Inguinal hernia    Menorrhagia    Ovarian cyst     Patient Active Problem List   Diagnosis Date Noted   Menorrhagia with regular cycle 12/26/2021    Past Surgical History:  Procedure Laterality Date   LAPAROSCOPIC BILATERAL SALPINGECTOMY Bilateral 05/12/2021   Procedure: LAPAROSCOPIC BILATERAL SALPINGECTOMY;  Surgeon: Schermerhorn, Gwen Her, MD;  Location: [facility];  Service: Gynecology;  Laterality: Bilateral;   LAPAROSCOPIC SUPRACERVICAL HYSTERECTOMY N/A 05/12/2021   Procedure: LAPAROSCOPIC SUPRACERVICAL HYSTERECTOMY;  Surgeon: Schermerhorn, Gwen Her, MD;  Location: [facility];  Service: Gynecology;  Laterality: N/A;   OVARIAN CYST SURGERY Left     OB History   No obstetric history on file.      Home Medications    Prior to Admission medications   Medication Sig Start Date End Date Taking? Authorizing Provider  fluticasone (FLONASE) 50 MCG/ACT nasal spray Place  1 spray into both nostrils 2 (two) times daily. 11/29/21  Yes Amyot, Nicholes Stairs, NP  hydrochlorothiazide (HYDRODIURIL) 25 MG tablet Take 1 tablet (25 mg total) by mouth daily. 03/20/22  Yes Margarette Canada, NP  ibuprofen (ADVIL) 800 MG tablet Take by mouth. 05/12/21  Yes [provider]  irbesartan (AVAPRO) 300 MG tablet Take 300 mg by mouth daily. 12/14/21  Yes [provider]  pantoprazole (PROTONIX) 40 MG tablet Take 1 tablet (40 mg total) by mouth 2 (two) times daily before a meal. For up to 2 weeks then once daily 12/26/21  Yes Amyot, Nicholes Stairs, NP    Family History Family History  Problem Relation Age of Onset   Healthy Mother     Social History Social History   Tobacco Use   Smoking status: Every Day    Packs/day: 0.50    Types: Cigars, Cigarettes   Smokeless tobacco: Never  Vaping Use   Vaping Use: Never used  Substance Use Topics   Alcohol use: Yes    Comment: on weekends   Drug use: Never     Allergies   Bee pollen and Hornet venom   Review of Systems Review of Systems  Eyes:  Negative for visual disturbance.  Respiratory:  Negative for shortness of breath.   Cardiovascular:  Negative for chest pain.  Neurological:  Positive for headaches. Negative for dizziness and syncope.  Physical Exam Triage Vital Signs ED Triage Vitals  Enc Vitals Group     BP 03/20/22 1953 (S) (!) 138/101     Pulse Rate 03/20/22 1953 83     Resp --      Temp 03/20/22 1953 98.1 F (36.7 C)     Temp Source 03/20/22 1953 Oral     SpO2 03/20/22 1953 95 %     Weight 03/20/22 1951 200 lb (90.7 kg)     Height 03/20/22 1951 5' 5.5" (1.664 m)     Head Circumference --      Peak Flow --      Pain Score 03/20/22 1951 8     Pain Loc --      Pain Edu? --      Excl. in [place]? --    No data found.  Updated Vital Signs BP (S) (!) 138/101 (BP Location: Left Arm)   Pulse 83   Temp 98.1 F (36.7 C) (Oral)   Ht 5' 5.5" (1.664 m)   Wt 200 lb (90.7 kg)   LMP 04/20/2021   SpO2  95%   BMI 32.78 kg/m   Visual Acuity Right Eye Distance:   Left Eye Distance:   Bilateral Distance:    Right Eye Near:   Left Eye Near:    Bilateral Near:     Physical Exam Vitals and nursing note reviewed.  Constitutional:      Appearance: Normal appearance. She is not ill-appearing.  HENT:     Head: Normocephalic and atraumatic.  Eyes:     General: No scleral icterus.    Extraocular Movements: Extraocular movements intact.     Pupils: Pupils are equal, round, and reactive to light.  Cardiovascular:     Rate and Rhythm: Normal rate and regular rhythm.     Pulses: Normal pulses.     Heart sounds: Normal heart sounds. No murmur heard.    No friction rub. No gallop.  Pulmonary:     Effort: Pulmonary effort is normal.     Breath sounds: Normal breath sounds. No wheezing, rhonchi or rales.  Skin:    General: Skin is warm and dry.     Capillary Refill: Capillary refill takes less than 2 seconds.  Neurological:     General: No focal deficit present.     Mental Status: She is alert and oriented to person, place, and time.  Psychiatric:        Mood and Affect: Mood normal.        Behavior: Behavior normal.        Thought Content: Thought content normal.        Judgment: Judgment normal.      UC Treatments / Results  Labs (all labs ordered are listed, but only abnormal results are displayed) Labs Reviewed  BASIC METABOLIC PANEL - Abnormal; Notable for the following components:      Result Value   Calcium 8.5 (*)    Anion gap 4 (*)    All other components within normal limits    EKG Normal sinus rhythm with a ventricular rate of 83 bpm PR interval 140 ms QRS duration 72 ms QT/QTc 356/418 ms No ST or T wave abnormalities.   Radiology No results found.  Procedures Procedures (including critical care time)  Medications Ordered in UC Medications - No data to display  Initial Impression / Assessment and Plan / UC Course  I have reviewed the triage vital  signs and the nursing notes.  Pertinent  labs & imaging results that were available during my care of the patient were reviewed by me and considered in my medical decision making (see chart for details).  Patient is a very pleasant, nontoxic-appearing 76 old female here for evaluation of elevated blood pressure and headache that is been going for last 2 days.  Her blood pressure is currently 138/101.  She is currently taking irbesartan 300 mg once daily and states that she has not missed any doses.  She denies any chest pain, change in vision, nausea, vomiting, or weakness.  On exam patient's pupils are equal round reactive and her EOM is intact.  Cardiopulmonary exam reveals S1-S2 heart sounds with regular rate and rhythm and lung sounds that are clear to auscultation all fields.  I will check an EKG and also a BMP.  If BMP is normal I will discharge her home on hydrochlorothiazide and have her follow-up with her PCP.  Patient's EKG shows normal sinus rhythm without any T wave or ST abnormalities.  BMP shows a mildly low calcium of 8.5 but renal function is unremarkable.  BUN is 9 creatinine is 0.64.  I will discharge patient home on hydrochlorothiazide 25 mg daily to add to her irbesartan.  I have also counseled her about smoking cessation and improving her exercise regimen.  I will have her follow-up with her PCP in 1 to 2 weeks for blood pressure check.   Final Clinical Impressions(s) / UC Diagnoses   Final diagnoses:  Essential hypertension     Discharge Instructions      Your EKG and renal function were normal today.  Continue to take your irbesartan 300 mg daily for control of your blood pressure and I am going to add on a second medication.  Start taking the hydrochlorothiazide tomorrow morning and take it each morning.  This will help pull off extra fluid which will help lower your blood pressure.  You need to make sure that you are drinking fluids throughout the day as well so  that you do not become dehydrated.  Continue to monitor your blood pressure at home and record it.  You need to schedule follow-up appointment with your primary care provider for 1 to 2 weeks for blood pressure check.  If you develop any chest pain, worsening headache, changes in vision, speech difficulty, or weakness in any of your extremities you need to call 911 and go to the ER.     ED Prescriptions     Medication Sig Dispense Auth. Provider   hydrochlorothiazide (HYDRODIURIL) 25 MG tablet Take 1 tablet (25 mg total) by mouth daily. 30 tablet Margarette Canada, NP      PDMP not reviewed this encounter.   Margarette Canada, NP 03/20/22 2033

## 2022-03-20 NOTE — Discharge Instructions (Addendum)
Your EKG and renal function were normal today.  Continue to take your irbesartan 300 mg daily for control of your blood pressure and I am going to add on a second medication.  Start taking the hydrochlorothiazide tomorrow morning and take it each morning.  This will help pull off extra fluid which will help lower your blood pressure.  You need to make sure that you are drinking fluids throughout the day as well so that you do not become dehydrated.  Continue to monitor your blood pressure at home and record it.  You need to schedule follow-up appointment with your primary care provider for 1 to 2 weeks for blood pressure check.  If you develop any chest pain, worsening headache, changes in vision, speech difficulty, or weakness in any of your extremities you need to call 911 and go to the ER.

## 2022-03-20 NOTE — ED Triage Notes (Signed)
Patient presents to UC for blood pressure issues.   Patient reports her BP has been elevated and and a headache for 2 days straight.   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

## 2022-04-26 ENCOUNTER — Ambulatory Visit
Admission: EM | Admit: 2022-04-26 | Discharge: 2022-04-26 | Disposition: A | Discharge status: Home / Self Care | Payer: BC Managed Care – PPO | Attending: Emergency Medicine | Admitting: Emergency Medicine

## 2022-04-26 DIAGNOSIS — M7651 Patellar tendinitis, right knee: Secondary | ICD-10-CM | POA: Diagnosis not present

## 2022-04-26 DIAGNOSIS — M7652 Patellar tendinitis, left knee: Secondary | ICD-10-CM | POA: Diagnosis not present

## 2022-04-26 MED ORDER — DICLOFENAC SODIUM 1 % EX GEL: 4.0000 g | Freq: Four times a day (QID) | CUTANEOUS | 0 refills | Status: AC

## 2022-04-26 NOTE — ED Triage Notes (Signed)
Pt reports bilateral knee pain that comes and goes x 1 week.

## 2022-04-26 NOTE — Discharge Instructions (Addendum)
Apply the Volatren gel every 6 hours as needed for pain.  Apply ice to both knee for 20 minutes at a time after work and as needed for pain.  Follow the home physical therapy handout given at discharge.  If no improvement you need to follow-up with orthopedics.

## 2022-04-26 NOTE — ED Provider Notes (Signed)
[facility]    CSN: [id number] Arrival date & time: 04/26/22  1830      History   Chief Complaint Chief Complaint  Patient presents with   Knee Pain    Bilateral     HPI Nicole [name] is a 41 y.o. female.   HPI  23 old female here for evaluation of bilateral knee pain.  Patient reports that she has been having off-and-on knee pain for the last week.  Does not associate with any particular injury.  She also denies any swelling.  She states the pain is in the front part of her knee just below her kneecap and then increases with walking or going up and down stairs.  No injuries reported.  Past Medical History:  Diagnosis Date   Anemia    COVID-19    Fibroid, uterine    Hypertension    Inguinal hernia    Menorrhagia    Ovarian cyst     Patient Active Problem List   Diagnosis Date Noted   Menorrhagia with regular cycle 12/26/2021    Past Surgical History:  Procedure Laterality Date   LAPAROSCOPIC BILATERAL SALPINGECTOMY Bilateral 05/12/2021   Procedure: LAPAROSCOPIC BILATERAL SALPINGECTOMY;  Surgeon: Schermerhorn, Gwen Her, MD;  Location: [facility];  Service: Gynecology;  Laterality: Bilateral;   LAPAROSCOPIC SUPRACERVICAL HYSTERECTOMY N/A 05/12/2021   Procedure: LAPAROSCOPIC SUPRACERVICAL HYSTERECTOMY;  Surgeon: Schermerhorn, Gwen Her, MD;  Location: [facility];  Service: Gynecology;  Laterality: N/A;   OVARIAN CYST SURGERY Left     OB History   No obstetric history on file.      Home Medications    Prior to Admission medications   Medication Sig Start Date End Date Taking? Authorizing Provider  diclofenac Sodium (VOLTAREN) 1 % GEL Apply 4 g topically 4 (four) times daily. 04/26/22  Yes Margarette Canada, NP  fluticasone (FLONASE) 50 MCG/ACT nasal spray Place 1 spray into both nostrils 2 (two) times daily. 11/29/21   Katy Apo, NP  hydrochlorothiazide (HYDRODIURIL) 25 MG tablet Take 1 tablet (25 mg total) by mouth daily. 03/20/22   Margarette Canada, NP   irbesartan (AVAPRO) 300 MG tablet Take 300 mg by mouth daily. 12/14/21   [provider]  pantoprazole (PROTONIX) 40 MG tablet Take 1 tablet (40 mg total) by mouth 2 (two) times daily before a meal. For up to 2 weeks then once daily 12/26/21   Amyot, Nicholes Stairs, NP    Family History Family History  Problem Relation Age of Onset   Healthy Mother     Social History Social History   Tobacco Use   Smoking status: Every Day    Packs/day: 0.50    Types: Cigars, Cigarettes   Smokeless tobacco: Never  Vaping Use   Vaping Use: Never used  Substance Use Topics   Alcohol use: Yes    Comment: on weekends   Drug use: Never     Allergies   Bee pollen and Hornet venom   Review of Systems Review of Systems  Musculoskeletal:  Positive for arthralgias. Negative for joint swelling.  Skin:  Negative for color change.  Hematological: Negative.   Psychiatric/Behavioral: Negative.       Physical Exam Triage Vital Signs ED Triage Vitals  Enc Vitals Group     BP 04/26/22 1842 (!) 144/102     Pulse Rate 04/26/22 1842 83     Resp 04/26/22 1842 16     Temp 04/26/22 1842 98.7 F (37.1 C)  Temp Source 04/26/22 1842 Oral     SpO2 04/26/22 1842 96 %     Weight 04/26/22 1845 209 lb (94.8 kg)     Height 04/26/22 1845 '5\' 6"'$  (1.676 m)     Head Circumference --      Peak Flow --      Pain Score 04/26/22 1842 7     Pain Loc --      Pain Edu? --      Excl. in [place]? --    No data found.  Updated Vital Signs BP (!) 144/102 (BP Location: Left Arm)   Pulse 83   Temp 98.7 F (37.1 C) (Oral)   Resp 16   Ht '5\' 6"'$  (1.676 m)   Wt 209 lb (94.8 kg)   LMP 04/20/2021   SpO2 96%   BMI 33.73 kg/m   Visual Acuity Right Eye Distance:   Left Eye Distance:   Bilateral Distance:    Right Eye Near:   Left Eye Near:    Bilateral Near:     Physical Exam Vitals and nursing note reviewed.  Constitutional:      Appearance: Normal appearance. She is not ill-appearing.  HENT:     Head:  Normocephalic and atraumatic.  Musculoskeletal:        General: Tenderness present. No swelling, deformity or signs of injury.  Skin:    General: Skin is warm and dry.     Capillary Refill: Capillary refill takes less than 2 seconds.     Findings: No bruising or erythema.  Neurological:     General: No focal deficit present.     Mental Status: She is alert and oriented to person, place, and time.  Psychiatric:        Mood and Affect: Mood normal.        Behavior: Behavior normal.        Thought Content: Thought content normal.        Judgment: Judgment normal.      UC Treatments / Results  Labs (all labs ordered are listed, but only abnormal results are displayed) Labs Reviewed - No data to display  EKG   Radiology No results found.  Procedures Procedures (including critical care time)  Medications Ordered in UC Medications - No data to display  Initial Impression / Assessment and Plan / UC Course  I have reviewed the triage vital signs and the nursing notes.  Pertinent labs & imaging results that were available during my care of the patient were reviewed by me and considered in my medical decision making (see chart for details).   Patient is a nontoxic-appearing 41 year old female here for evaluation of bilateral knee pain that is been off and on for the past week and is not in the setting of any injury.  There is no swelling, erythema, or ecchymosis noted.  On exam both the patient's knees are normal anatomical alignment.  There is no tenderness or crepitus with palpation of the patella, palpation the medial lateral joint line, palpation of the tibial tuberosity, or palpation of the popliteal fossa.  Patient does have mild swelling and tenderness of the medial aspect of the patellar tendon bilaterally.  I suspect the patient has patellar tendinitis and I will treat her with topical Voltaren gel, ice, and home physical therapy.  Work note provided.   Final Clinical  Impressions(s) / UC Diagnoses   Final diagnoses:  Patellar tendonitis of both knees     Discharge Instructions  Apply the Volatren gel every 6 hours as needed for pain.  Apply ice to both knee for 20 minutes at a time after work and as needed for pain.  Follow the home physical therapy handout given at discharge.  If no improvement you need to follow-up with orthopedics.      ED Prescriptions     Medication Sig Dispense Auth. Provider   diclofenac Sodium (VOLTAREN) 1 % GEL Apply 4 g topically 4 (four) times daily. 100 g Margarette Canada, NP      PDMP not reviewed this encounter.   Margarette Canada, NP 04/26/22 1905

## 2022-05-10 ENCOUNTER — Ambulatory Visit
Admission: EM | Admit: 2022-05-10 | Discharge: 2022-05-10 | Disposition: A | Discharge status: Home / Self Care | Payer: BC Managed Care – PPO | Attending: Emergency Medicine | Admitting: Emergency Medicine

## 2022-05-10 DIAGNOSIS — I10 Essential (primary) hypertension: Secondary | ICD-10-CM

## 2022-05-10 MED ORDER — HYDROCHLOROTHIAZIDE 25 MG PO TABS: 25.0000 mg | ORAL_TABLET | Freq: Every day | ORAL | 1 refills | Status: AC

## 2022-05-10 NOTE — Discharge Instructions (Signed)
Pick up and start the HCTZ tomorrow morning.  Keep taking your Irbesartan as directed.  Follow-up with your PCP next week.

## 2022-05-10 NOTE — ED Triage Notes (Signed)
Patient reports that she thinks her BP was elevated. Patient reports this AM she was "swimmy headed" and her head had been hurting.

## 2022-05-10 NOTE — ED Provider Notes (Signed)
[facility]    CSN: [id number] Arrival date & time: 05/10/22  [place]      History   Chief Complaint Chief Complaint  Patient presents with   Hypertension    HPI Nicole [name] is a 41 y.o. female.   HPI  41 year old female here for evaluation of blood pressure issue.  Patient reports that she got up to go to work this morning and she think she got up too fast because she had an episode of headache and felt swimmy headed.  The headache resolved with ibuprofen and the swimmy headedness resolved on its own.  She denies any chest pain, dizziness, or shortness of breath.  She was seen in this clinic on 03/20/2022 for elevated blood pressure and was started on hydrochlorothiazide but she reports that she did not stop by the pharmacy and pick it up so she has not started her second agent.  She has taken irbesartan 300 mg daily.  Past Medical History:  Diagnosis Date   Anemia    COVID-19    Fibroid, uterine    Hypertension    Inguinal hernia    Menorrhagia    Ovarian cyst     Patient Active Problem List   Diagnosis Date Noted   Menorrhagia with regular cycle 12/26/2021    Past Surgical History:  Procedure Laterality Date   LAPAROSCOPIC BILATERAL SALPINGECTOMY Bilateral 05/12/2021   Procedure: LAPAROSCOPIC BILATERAL SALPINGECTOMY;  Surgeon: Schermerhorn, Gwen Her, MD;  Location: [facility];  Service: Gynecology;  Laterality: Bilateral;   LAPAROSCOPIC SUPRACERVICAL HYSTERECTOMY N/A 05/12/2021   Procedure: LAPAROSCOPIC SUPRACERVICAL HYSTERECTOMY;  Surgeon: Schermerhorn, Gwen Her, MD;  Location: [facility];  Service: Gynecology;  Laterality: N/A;   OVARIAN CYST SURGERY Left     OB History   No obstetric history on file.      Home Medications    Prior to Admission medications   Medication Sig Start Date End Date Taking? Authorizing Provider  irbesartan (AVAPRO) 300 MG tablet Take 300 mg by mouth daily. 12/14/21  Yes [provider]  pantoprazole (PROTONIX)  40 MG tablet Take 1 tablet (40 mg total) by mouth 2 (two) times daily before a meal. For up to 2 weeks then once daily 12/26/21  Yes Amyot, Nicholes Stairs, NP  diclofenac Sodium (VOLTAREN) 1 % GEL Apply 4 g topically 4 (four) times daily. 04/26/22   Margarette Canada, NP  fluticasone (FLONASE) 50 MCG/ACT nasal spray Place 1 spray into both nostrils 2 (two) times daily. 11/29/21   Katy Apo, NP  hydrochlorothiazide (HYDRODIURIL) 25 MG tablet Take 1 tablet (25 mg total) by mouth daily. 05/10/22   Margarette Canada, NP    Family History Family History  Problem Relation Age of Onset   Healthy Mother     Social History Social History   Tobacco Use   Smoking status: Every Day    Packs/day: 0.50    Types: Cigars, Cigarettes   Smokeless tobacco: Never  Vaping Use   Vaping Use: Never used  Substance Use Topics   Alcohol use: Yes    Comment: on weekends   Drug use: Never     Allergies   Bee pollen and Hornet venom   Review of Systems Review of Systems  Respiratory:  Negative for shortness of breath.   Cardiovascular:  Negative for chest pain.  Neurological:  Positive for light-headedness and headaches. Negative for dizziness.     Physical Exam Triage Vital Signs ED Triage Vitals  Enc Vitals Group  BP 05/10/22 1853 (!) 139/95     Pulse Rate 05/10/22 1853 75     Resp --      Temp 05/10/22 1853 99.1 F (37.3 C)     Temp Source 05/10/22 1853 Oral     SpO2 05/10/22 1853 97 %     Weight 05/10/22 1852 210 lb (95.3 kg)     Height 05/10/22 1852 '5\' 6"'$  (1.676 m)     Head Circumference --      Peak Flow --      Pain Score 05/10/22 1852 5     Pain Loc --      Pain Edu? --      Excl. in [place]? --    No data found.  Updated Vital Signs BP (!) 139/95 (BP Location: Left Arm)   Pulse 75   Temp 99.1 F (37.3 C) (Oral)   Ht '5\' 6"'$  (1.676 m)   Wt 210 lb (95.3 kg)   LMP 04/20/2021   SpO2 97%   BMI 33.89 kg/m   Visual Acuity Right Eye Distance:   Left Eye Distance:   Bilateral  Distance:    Right Eye Near:   Left Eye Near:    Bilateral Near:     Physical Exam Vitals and nursing note reviewed.  Constitutional:      Appearance: Normal appearance. She is not ill-appearing.  HENT:     Head: Normocephalic and atraumatic.  Cardiovascular:     Rate and Rhythm: Normal rate and regular rhythm.     Pulses: Normal pulses.     Heart sounds: Normal heart sounds. No murmur heard.    No friction rub. No gallop.  Pulmonary:     Effort: Pulmonary effort is normal.     Breath sounds: Normal breath sounds. No wheezing, rhonchi or rales.  Skin:    General: Skin is warm and dry.     Capillary Refill: Capillary refill takes less than 2 seconds.  Neurological:     General: No focal deficit present.     Mental Status: She is alert and oriented to person, place, and time.  Psychiatric:        Mood and Affect: Mood normal.        Behavior: Behavior normal.        Thought Content: Thought content normal.        Judgment: Judgment normal.      UC Treatments / Results  Labs (all labs ordered are listed, but only abnormal results are displayed) Labs Reviewed - No data to display  EKG   Radiology No results found.  Procedures Procedures (including critical care time)  Medications Ordered in UC Medications - No data to display  Initial Impression / Assessment and Plan / UC Course  I have reviewed the triage vital signs and the nursing notes.  Pertinent labs & imaging results that were available during my care of the patient were reviewed by me and considered in my medical decision making (see chart for details).   Patient is a nontoxic-appearing 41 year old female here for evaluation of possible elevated blood pressure.  She states she got up to go to work this morning and she thinks she got up too fast because she had a short-lived episode of swimmy headedness and also a headache.  The headache resolved with ibuprofen and swimmy headedness resolved on his own.   Neither are present now.  There is no chest pain or shortness of breath present.  She does have a history  of hypertension and has been on irbesartan 300 mg and is continuing to have elevated blood pressures.  Currently she is 139/95.  She was evaluated a month ago and was started on hydrochlorothiazide but she reports that she never picked up the medication or started it.  I have advised the patient to pick up the hydrochlorothiazide and start it tomorrow morning and keep taking her irbesartan as previously prescribed.  She is to make an appointment to follow-up with her PCP next week to evaluate her the responsiveness of her blood pressure to the hydrochlorothiazide.  Patient's physical exam reveals a benign cardiopulmonary exam with S1-S2 heart sounds and regular rate and rhythm and lung sounds are" all fields.   Final Clinical Impressions(s) / UC Diagnoses   Final diagnoses:  Primary hypertension     Discharge Instructions      Pick up and start the HCTZ tomorrow morning.  Keep taking your Irbesartan as directed.  Follow-up with your PCP next week.      ED Prescriptions     Medication Sig Dispense Auth. Provider   hydrochlorothiazide (HYDRODIURIL) 25 MG tablet Take 1 tablet (25 mg total) by mouth daily. 30 tablet Margarette Canada, NP      PDMP not reviewed this encounter.   Margarette Canada, NP 05/10/22 1932

## 2022-05-24 ENCOUNTER — Ambulatory Visit
Admission: EM | Admit: 2022-05-24 | Discharge: 2022-05-24 | Disposition: A | Discharge status: Home / Self Care | Payer: BC Managed Care – PPO | Attending: Emergency Medicine | Admitting: Emergency Medicine

## 2022-05-24 DIAGNOSIS — U071 COVID-19: Secondary | ICD-10-CM | POA: Insufficient documentation

## 2022-05-24 LAB — RESP PANEL BY RT-PCR (FLU A&B, COVID) ARPGX2
Influenza A by PCR: NEGATIVE
Influenza B by PCR: NEGATIVE
SARS Coronavirus 2 by RT PCR: POSITIVE — AB

## 2022-05-24 MED ORDER — MOLNUPIRAVIR EUA 200MG CAPSULE: 4.0000 | ORAL_CAPSULE | Freq: Two times a day (BID) | ORAL | 0 refills | Status: AC

## 2022-05-24 NOTE — ED Triage Notes (Addendum)
Pt c/o headache x2 days, spitting up some phlegm, body aches, no fevers. Pt states pounding headache yesterday. 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 Pt wanting to be tested for flu & covid

## 2022-05-24 NOTE — ED Provider Notes (Signed)
[facility]    CSN: [id number] Arrival date & time: 05/24/22  1553      History   Chief Complaint Chief Complaint  Patient presents with   Headache   Generalized Body Aches    HPI Nicole [name] is a 41 y.o. female.   Patient is a 41 year old female who presents with chief complaint of headache, body aches, stuffy nose and sneezing x2 days.  Patient also reports production of some brown phlegm.  Patient denies any ear issues or coughing.  She reports taking some ibuprofen for her headache.  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  She is requesting testing.  Patient reports she has has been taking both of her blood pressure medications.  She denies any chest pain or shortness of breath.    Past Medical History:  Diagnosis Date   Anemia    COVID-19    Fibroid, uterine    Hypertension    Inguinal hernia    Menorrhagia    Ovarian cyst     Patient Active Problem List   Diagnosis Date Noted   Menorrhagia with regular cycle 12/26/2021    Past Surgical History:  Procedure Laterality Date   LAPAROSCOPIC BILATERAL SALPINGECTOMY Bilateral 05/12/2021   Procedure: LAPAROSCOPIC BILATERAL SALPINGECTOMY;  Surgeon: Schermerhorn, Gwen Her, MD;  Location: [facility];  Service: Gynecology;  Laterality: Bilateral;   LAPAROSCOPIC SUPRACERVICAL HYSTERECTOMY N/A 05/12/2021   Procedure: LAPAROSCOPIC SUPRACERVICAL HYSTERECTOMY;  Surgeon: Schermerhorn, Gwen Her, MD;  Location: [facility];  Service: Gynecology;  Laterality: N/A;   OVARIAN CYST SURGERY Left     OB History   No obstetric history on file.      Home Medications    Prior to Admission medications   Medication Sig Start Date End Date Taking? Authorizing Provider  diclofenac Sodium (VOLTAREN) 1 % GEL Apply 4 g topically 4 (four) times daily. 04/26/22  Yes Margarette Canada, NP  fluticasone (FLONASE) 50 MCG/ACT nasal spray Place 1 spray into both nostrils 2 (two)  times daily. 11/29/21  Yes Amyot, Nicholes Stairs, NP  hydrochlorothiazide (HYDRODIURIL) 25 MG tablet Take 1 tablet (25 mg total) by mouth daily. 05/10/22  Yes Margarette Canada, NP  irbesartan (AVAPRO) 300 MG tablet Take 300 mg by mouth daily. 12/14/21  Yes [provider]  molnupiravir EUA (LAGEVRIO) 200 mg CAPS capsule Take 4 capsules (800 mg total) by mouth 2 (two) times daily for 5 days. 05/24/22 05/29/22 Yes Luvenia Redden, PA-C  pantoprazole (PROTONIX) 40 MG tablet Take 1 tablet (40 mg total) by mouth 2 (two) times daily before a meal. For up to 2 weeks then once daily 12/26/21  Yes Amyot, Nicholes Stairs, NP    Family History Family History  Problem Relation Age of Onset   Healthy Mother     Social History Social History   Tobacco Use   Smoking status: Every Day    Packs/day: 0.50    Types: Cigars, Cigarettes   Smokeless tobacco: Never  Vaping Use   Vaping Use: Never used  Substance Use Topics   Alcohol use: Yes    Comment: on weekends   Drug use: Never     Allergies   Bee pollen and Hornet venom   Review of Systems Review of Systems as above in HPI.  Other systems reviewed and found to be negative   Physical Exam Triage Vital Signs ED Triage Vitals  Enc Vitals Group  BP 05/24/22 1632 (!) 144/93     Pulse Rate 05/24/22 1632 90     Resp --      Temp 05/24/22 1632 99.5 F (37.5 C)     Temp Source 05/24/22 1632 Oral     SpO2 05/24/22 1632 98 %     Weight 05/24/22 1630 210 lb (95.3 kg)     Height 05/24/22 1630 '5\' 6"'$  (1.676 m)     Head Circumference --      Peak Flow --      Pain Score 05/24/22 1630 6     Pain Loc --      Pain Edu? --      Excl. in [place]? --    No data found.  Updated Vital Signs BP (!) 144/93 (BP Location: Left Arm)   Pulse 90   Temp 99.5 F (37.5 C) (Oral)   Ht '5\' 6"'$  (1.676 m)   Wt 210 lb (95.3 kg)   LMP 04/20/2021   SpO2 98%   BMI 33.89 kg/m   Visual Acuity Right Eye Distance:   Left Eye Distance:   Bilateral Distance:    Right  Eye Near:   Left Eye Near:    Bilateral Near:     Physical Exam Constitutional:      Appearance: She is well-developed.  HENT:     Head: Normocephalic and atraumatic.     Ears:     Comments: Unable to evaluate bilateral TM due to obstruction of the canal by cerumen    Nose: Congestion present.     Right Sinus: No maxillary sinus tenderness or frontal sinus tenderness.     Left Sinus: No maxillary sinus tenderness or frontal sinus tenderness.     Mouth/Throat:     Mouth: Mucous membranes are moist.     Comments: Clear post nasal drainage Eyes:     Extraocular Movements: Extraocular movements intact.     Pupils: Pupils are equal, round, and reactive to light.  Cardiovascular:     Rate and Rhythm: Normal rate and regular rhythm.     Heart sounds: Normal heart sounds. No murmur heard. Pulmonary:     Effort: Pulmonary effort is normal.     Breath sounds: No wheezing or rhonchi.  Musculoskeletal:     Cervical back: Normal range of motion.  Lymphadenopathy:     Cervical: No cervical adenopathy.  Skin:    General: Skin is warm and dry.  Neurological:     Mental Status: She is alert and oriented to person, place, and time.      UC Treatments / Results  Labs (all labs ordered are listed, but only abnormal results are displayed) Labs Reviewed  RESP PANEL BY RT-PCR (FLU A&B, COVID) [id number] - Abnormal; Notable for the following components:      Result Value   SARS Coronavirus 2 by RT PCR POSITIVE (*)    All other components within normal limits    EKG   Radiology No results found.  Procedures Procedures (including critical care time)  Medications Ordered in UC Medications - No data to display  Initial Impression / Assessment and Plan / UC Course  I have reviewed the triage vital signs and the nursing notes.  Pertinent labs & imaging results that were available during my care of the patient were reviewed by me and considered in my medical decision making (see chart for  details).    Patient reporting headache, body aches, stuffy nose and sneezing x2 days.  Patient reports  COVID contacts at work.  No clear postnasal drainage.  Exam relatively benign.  Temp above 1.5.  Blood pressure is elevated.  COVID swab was positive.  Give her prescription from [facility].  Advised her she can use over-the-counter ibuprofen and Tylenol for headaches and pain or other medications for symptom management.  Other instructions as below Final Clinical Impressions(s) / UC Diagnoses   Final diagnoses:  [id number]     Discharge Instructions      -Your COVID test was positive   -You will have to quarantine for 5 days from the start of your symptoms.  After 5 days you can break quarantine if your symptoms have improved and you have not had a fever for 24 hours without taking Tylenol or ibuprofen.  Use over-the-counter Tylenol and ibuprofen as needed for body aches and fever.  -You can use over-the-counter medications for symptom management.  -Molnupiravir: Take 4 capsules twice a day for 5 days as an antiviral to help lessen the symptoms and length of your illness  If you develop any increased shortness of breath-especially at rest, you are unable to speak in full sentences, or is a late sign your lips are turning blue you need to go the ER for evaluation.      ED Prescriptions     Medication Sig Dispense Auth. Provider   molnupiravir EUA (LAGEVRIO) 200 mg CAPS capsule Take 4 capsules (800 mg total) by mouth 2 (two) times daily for 5 days. 40 capsule Luvenia Redden, PA-C      PDMP not reviewed this encounter.   Luvenia Redden, PA-C 05/24/22 1738

## 2022-05-24 NOTE — Discharge Instructions (Addendum)
-  Your COVID test was positive   -You will have to quarantine for 5 days from the start of your symptoms.  After 5 days you can break quarantine if your symptoms have improved and you have not had a fever for 24 hours without taking Tylenol or ibuprofen.  Use over-the-counter Tylenol and ibuprofen as needed for body aches and fever.  -You can use over-the-counter medications for symptom management.  -Molnupiravir: Take 4 capsules twice a day for 5 days as an antiviral to help lessen the symptoms and length of your illness  If you develop any increased shortness of breath-especially at rest, you are unable to speak in full sentences, or is a late sign your lips are turning blue you need to go the ER for evaluation.

## 2022-08-25 ENCOUNTER — Ambulatory Visit
Admission: EM | Admit: 2022-08-25 | Discharge: 2022-08-25 | Disposition: A | Discharge status: Home / Self Care | Payer: BC Managed Care – PPO | Attending: Family Medicine | Admitting: Family Medicine

## 2022-08-25 DIAGNOSIS — J101 Influenza due to other identified influenza virus with other respiratory manifestations: Secondary | ICD-10-CM | POA: Insufficient documentation

## 2022-08-25 LAB — RAPID INFLUENZA A&B ANTIGENS
Influenza A (ARMC): POSITIVE — AB
Influenza B (ARMC): NEGATIVE

## 2022-08-25 MED ORDER — BENZONATATE 100 MG PO CAPS: 200.0000 mg | ORAL_CAPSULE | Freq: Three times a day (TID) | ORAL | 0 refills | Status: DC | PRN

## 2022-08-25 MED ORDER — OSELTAMIVIR PHOSPHATE 75 MG PO CAPS: 75.0000 mg | ORAL_CAPSULE | Freq: Two times a day (BID) | ORAL | 0 refills | Status: DC

## 2022-08-25 MED ORDER — PROMETHAZINE-DM 6.25-15 MG/5ML PO SYRP: 5.0000 mL | ORAL_SOLUTION | Freq: Four times a day (QID) | ORAL | 0 refills | Status: DC | PRN

## 2022-08-25 NOTE — Discharge Instructions (Addendum)
You have influenza A. If your were prescribed medication, stop by the pharmacy to pick them up.  You can take Tylenol and/or Ibuprofen as needed for fever reduction and pain relief.    For cough: honey 1/2 to 1 teaspoon (you can dilute the honey in water or another fluid).  You can also use guaifenesin and dextromethorphan for cough. You can use a humidifier for chest congestion and cough.  If you don't have a humidifier, you can sit in the bathroom with the hot shower running.      For sore throat: try warm salt water gargles, Mucinex sore throat cough drops or cepacol lozenges, throat spray, warm tea or water with lemon/honey, popsicles or ice, or OTC cold relief medicine for throat discomfort. You can also purchase chloraseptic spray at the pharmacy or dollar store.   For congestion: take a daily anti-histamine like Zyrtec, Claritin, and a oral decongestant, such as pseudoephedrine.  You can also use Flonase 1-2 sprays in each nostril daily. Afrin is also a good option, if you do not have high blood pressure.    It is important to stay hydrated: drink plenty of fluids (water, gatorade/powerade/pedialyte, juices, or teas) to keep your throat moisturized and help further relieve irritation/discomfort.    Return or go to the Emergency Department if symptoms worsen or do not improve in the next few days

## 2022-08-25 NOTE — ED Triage Notes (Signed)
Pt c/o body aches, cough, headache x2days  Pt has been around coworkers who was sick with flu

## 2022-08-25 NOTE — ED Provider Notes (Signed)
[facility]    CSN: [id number] Arrival date & time: 08/25/22  1236      History   Chief Complaint Chief Complaint  Patient presents with   Generalized Body Aches   Cough    HPI Nicole [name] is a 42 y.o. female.   HPI   Nicole [name] presents for body aches, cough, rhinorrhea and headache that started Wednesday night. No fever but chills. Has diarrhea and nausea. No vomiting, ear pain, sore throat, shortness of breath. Has chest comfort with coughing a lot. Took some OTC meds with some relief. No history of asthma or smoking.      Past Medical History:  Diagnosis Date   Anemia    COVID-19    Fibroid, uterine    Hypertension    Inguinal hernia    Menorrhagia    Ovarian cyst     Patient Active Problem List   Diagnosis Date Noted   Menorrhagia with regular cycle 12/26/2021    Past Surgical History:  Procedure Laterality Date   LAPAROSCOPIC BILATERAL SALPINGECTOMY Bilateral 05/12/2021   Procedure: LAPAROSCOPIC BILATERAL SALPINGECTOMY;  Surgeon: Schermerhorn, Gwen Her, MD;  Location: [facility];  Service: Gynecology;  Laterality: Bilateral;   LAPAROSCOPIC SUPRACERVICAL HYSTERECTOMY N/A 05/12/2021   Procedure: LAPAROSCOPIC SUPRACERVICAL HYSTERECTOMY;  Surgeon: Schermerhorn, Gwen Her, MD;  Location: [facility];  Service: Gynecology;  Laterality: N/A;   OVARIAN CYST SURGERY Left     OB History   No obstetric history on file.      Home Medications    Prior to Admission medications   Medication Sig Start Date End Date Taking? Authorizing Provider  benzonatate (TESSALON) 100 MG capsule Take 2 capsules (200 mg total) by mouth 3 (three) times daily as needed for cough. 08/25/22  Yes Mikaeel Petrow, DO  diclofenac Sodium (VOLTAREN) 1 % GEL Apply 4 g topically 4 (four) times daily. 04/26/22  Yes Margarette Canada, NP  fluticasone (FLONASE) 50 MCG/ACT nasal spray Place 1 spray into both nostrils 2 (two) times daily. 11/29/21  Yes Amyot, Nicholes Stairs, NP   hydrochlorothiazide (HYDRODIURIL) 25 MG tablet Take 1 tablet (25 mg total) by mouth daily. 05/10/22  Yes Margarette Canada, NP  irbesartan (AVAPRO) 300 MG tablet Take 300 mg by mouth daily. 12/14/21  Yes [provider]  oseltamivir (TAMIFLU) 75 MG capsule Take 1 capsule (75 mg total) by mouth every 12 (twelve) hours. 08/25/22  Yes Chloe Miyoshi, DO  pantoprazole (PROTONIX) 40 MG tablet Take 1 tablet (40 mg total) by mouth 2 (two) times daily before a meal. For up to 2 weeks then once daily 12/26/21  Yes Amyot, Nicholes Stairs, NP  promethazine-dextromethorphan (PROMETHAZINE-DM) 6.25-15 MG/5ML syrup Take 5 mLs by mouth 4 (four) times daily as needed. 08/25/22  Yes Lyndee Hensen, DO    Family History Family History  Problem Relation Age of Onset   Healthy Mother     Social History Social History   Tobacco Use   Smoking status: Every Day    Packs/day: 0.50    Types: Cigars, Cigarettes   Smokeless tobacco: Never  Vaping Use   Vaping Use: Never used  Substance Use Topics   Alcohol use: Yes    Comment: on weekends   Drug use: Never     Allergies   Bee pollen and Hornet venom   Review of Systems Review of Systems: negative unless otherwise stated in HPI.      Physical Exam Triage Vital Signs ED Triage Vitals  Enc Vitals Group  BP 08/25/22 1358 93/69     Pulse Rate 08/25/22 1358 81     Resp 08/25/22 1358 18     Temp 08/25/22 1358 98.3 F (36.8 C)     Temp Source 08/25/22 1358 Oral     SpO2 08/25/22 1358 97 %     Weight 08/25/22 1357 212 lb (96.2 kg)     Height 08/25/22 1357 '5\' 6"'$  (1.676 m)     Head Circumference --      Peak Flow --      Pain Score 08/25/22 1357 9     Pain Loc --      Pain Edu? --      Excl. in [place]? --    No data found.  Updated Vital Signs BP 95/63 (BP Location: Left Arm)   Pulse 81   Temp 98.3 F (36.8 C) (Oral)   Resp 18   Ht '5\' 6"'$  (1.676 m)   Wt 96.2 kg   LMP 04/20/2021   SpO2 97%   BMI 34.22 kg/m   Visual Acuity Right Eye  Distance:   Left Eye Distance:   Bilateral Distance:    Right Eye Near:   Left Eye Near:    Bilateral Near:     Physical Exam GEN:     alert, ill but non-toxic appearing female in no distress laying down on exam table   HENT:  mucus membranes moist, oropharyngeal without erythema, lesions or exudate, no tonsillar hypertrophy, clear nasal discharge,  EYES:   pupils equal and reactive, no scleral injection or discharge NECK:  normal ROM, no meningismus   RESP:  no increased work of breathing, clear to auscultation bilaterally CVS:   regular rate and rhythm Skin:   warm and dry    UC Treatments / Results  Labs (all labs ordered are listed, but only abnormal results are displayed) Labs Reviewed  RAPID INFLUENZA A&B ANTIGENS - Abnormal; Notable for the following components:      Result Value   Influenza A ([facility]) POSITIVE (*)    All other components within normal limits    EKG   Radiology No results found.  Procedures Procedures (including critical care time)  Medications Ordered in UC Medications - No data to display  Initial Impression / Assessment and Plan / UC Course  I have reviewed the triage vital signs and the nursing notes.  Pertinent labs & imaging results that were available during my care of the patient were reviewed by me and considered in my medical decision making (see chart for details).       Pt is a 42 y.o. female who presents for 1-2 days of flu-like symptoms. [name] is afebrile here without recent antipyretics. Satting well on room air. Overall pt is ill but non-toxic appearing, well hydrated, without respiratory distress. Pulmonary exam is unremarkable.  COVID and influenza testing obtained. History consistent with viral respiratory illness.   She is influenza A positive. She is within the treatment window and interested in Tamiflu.  Tessalon Perles, promethazine-DM cough syrup and Tamiflu sent to the pharmacy. Discussed symptomatic treatment.   Explained lack of efficacy of antibiotics in viral disease.  Typical duration of symptoms discussed.   Return and ED precautions given and voiced understanding. Discussed MDM, treatment plan and plan for follow-up with patient who agrees with plan.     Final Clinical Impressions(s) / UC Diagnoses   Final diagnoses:  Influenza A     Discharge Instructions      You have  influenza A. If your were prescribed medication, stop by the pharmacy to pick them up.  You can take Tylenol and/or Ibuprofen as needed for fever reduction and pain relief.    For cough: honey 1/2 to 1 teaspoon (you can dilute the honey in water or another fluid).  You can also use guaifenesin and dextromethorphan for cough. You can use a humidifier for chest congestion and cough.  If you don't have a humidifier, you can sit in the bathroom with the hot shower running.      For sore throat: try warm salt water gargles, Mucinex sore throat cough drops or cepacol lozenges, throat spray, warm tea or water with lemon/honey, popsicles or ice, or OTC cold relief medicine for throat discomfort. You can also purchase chloraseptic spray at the pharmacy or dollar store.   For congestion: take a daily anti-histamine like Zyrtec, Claritin, and a oral decongestant, such as pseudoephedrine.  You can also use Flonase 1-2 sprays in each nostril daily. Afrin is also a good option, if you do not have high blood pressure.    It is important to stay hydrated: drink plenty of fluids (water, gatorade/powerade/pedialyte, juices, or teas) to keep your throat moisturized and help further relieve irritation/discomfort.    Return or go to the Emergency Department if symptoms worsen or do not improve in the next few days      ED Prescriptions     Medication Sig Dispense Auth. Provider   oseltamivir (TAMIFLU) 75 MG capsule Take 1 capsule (75 mg total) by mouth every 12 (twelve) hours. 10 capsule Lanissa Cashen, DO    promethazine-dextromethorphan (PROMETHAZINE-DM) 6.25-15 MG/5ML syrup Take 5 mLs by mouth 4 (four) times daily as needed. 118 mL Zyheir Daft, DO   benzonatate (TESSALON) 100 MG capsule Take 2 capsules (200 mg total) by mouth 3 (three) times daily as needed for cough. 21 capsule Lyndee Hensen, DO      PDMP not reviewed this encounter.   Lyndee Hensen, DO 08/25/22 1445

## 2023-03-26 IMAGING — CT CT ABD-PELV W/ CM
2 of 5 series · 15 of 46 positions shown, 17 images · IV contrast (APPLIED)
Comparison: None.

CLINICAL DATA: Right lower quadrant abdominal pain starting on
[REDACTED]. Vaginal bleeding and abdominal cramping.

EXAM:
CT ABDOMEN AND PELVIS WITH CONTRAST
TECHNIQUE: Multidetector CT imaging of the abdomen and pelvis was performed
using the standard protocol following bolus administration of
intravenous contrast.
CONTRAST:  100mL OMNIPAQUE IOHEXOL 300 MG/ML  SOLN

[Series 2: routine abd/pel with · axial · 0.86mm/px · z∈[-919,-489]mm · 12 of 98 slices shown, 14 images]
[im 6/98  soft-tissue]
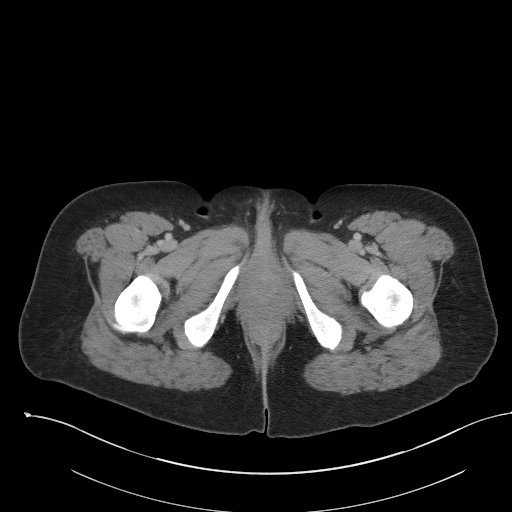
[im 6/98  bone]
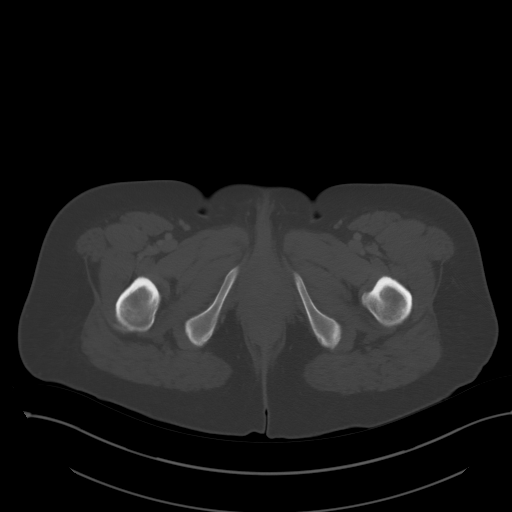
[im 18/98  soft-tissue]
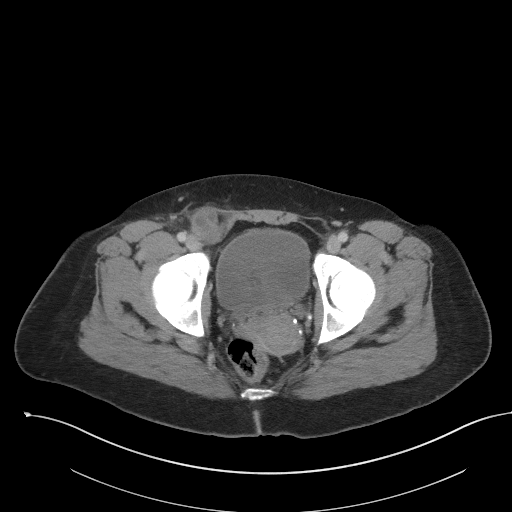
[im 23/98  soft-tissue]
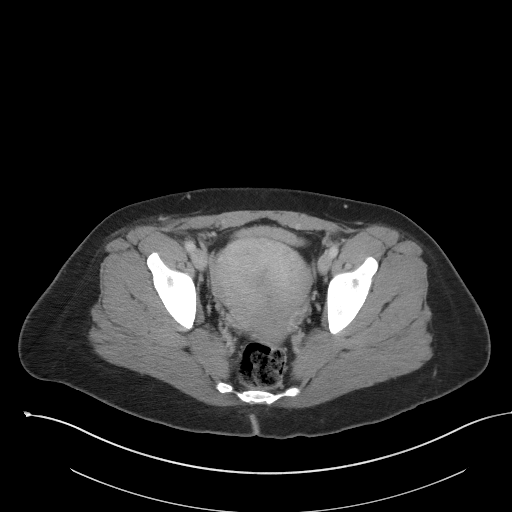
[im 29/98  soft-tissue]
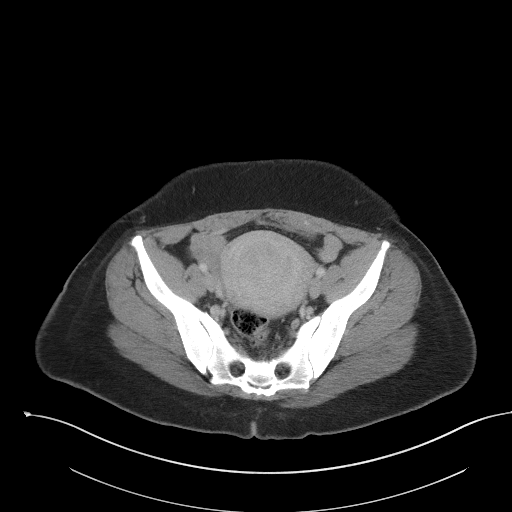
[im 40/98  soft-tissue]
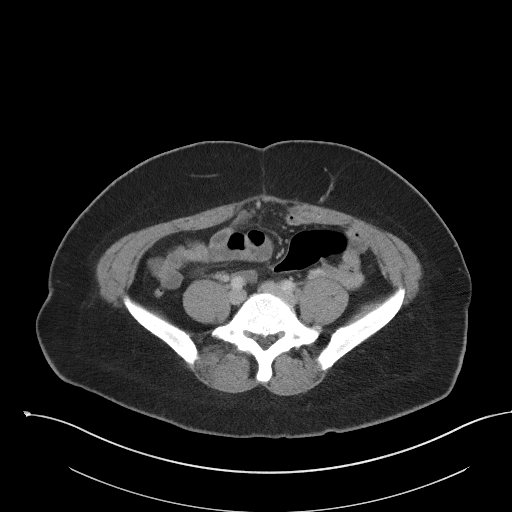
[im 46/98  soft-tissue]
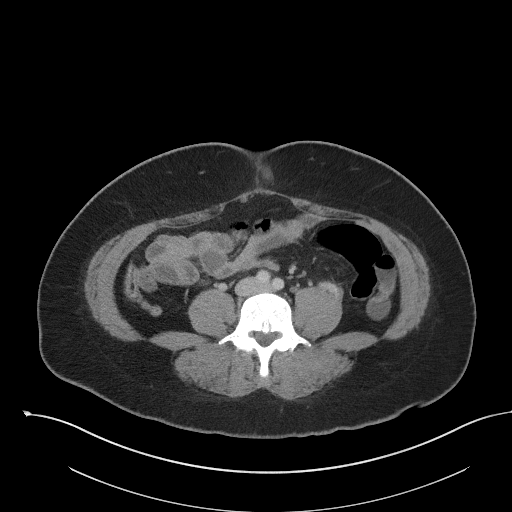
[im 52/98  soft-tissue]
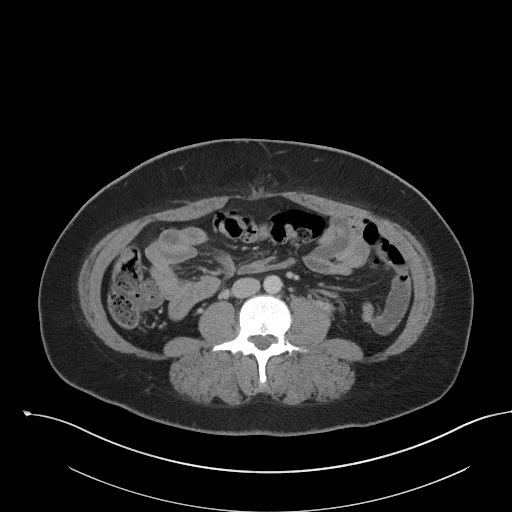
[im 63/98  soft-tissue]
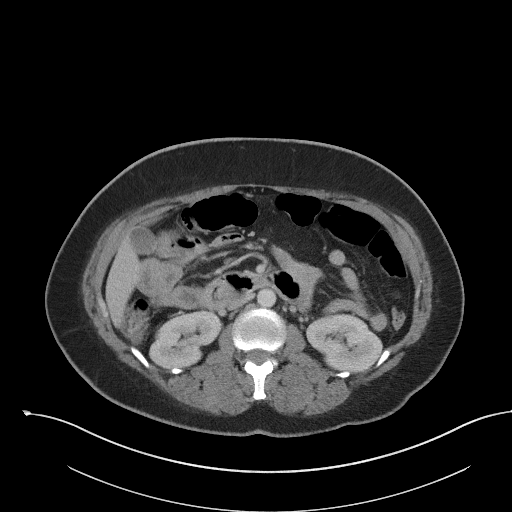
[im 69/98  soft-tissue]
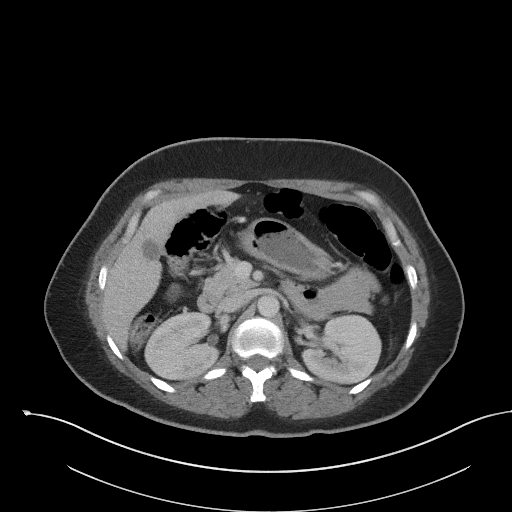
[im 69/98  bone]
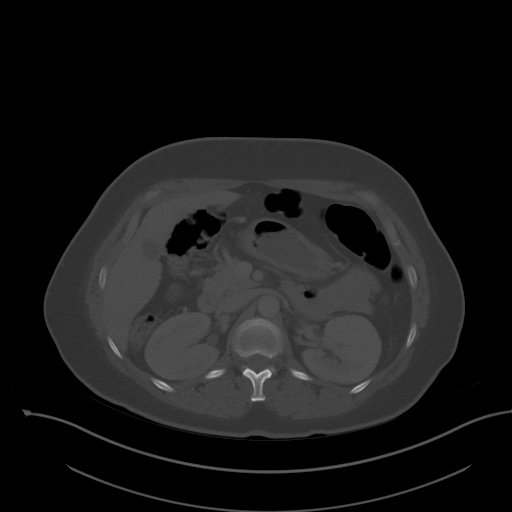
[im 75/98  soft-tissue]
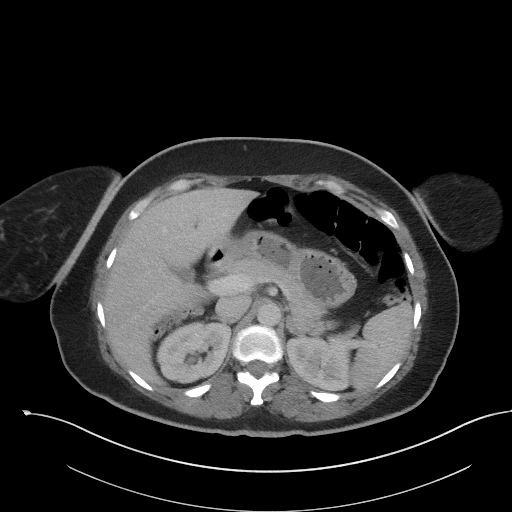
[im 86/98  soft-tissue]
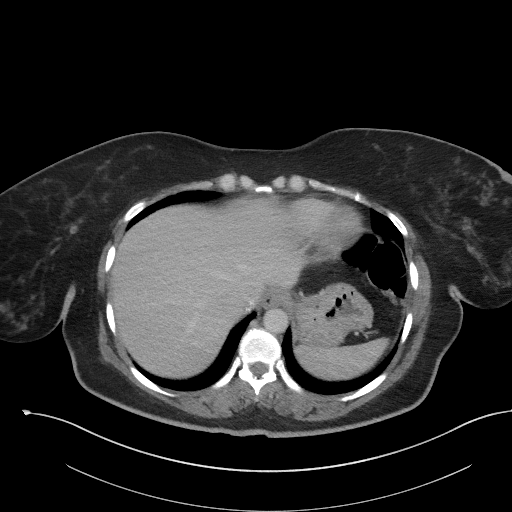
[im 92/98  soft-tissue]
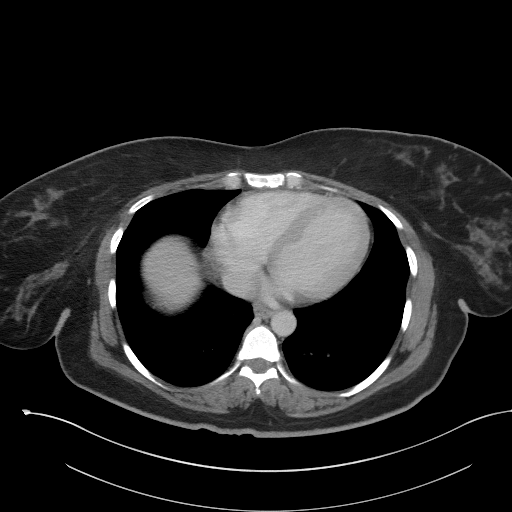

[Series 5: coronal st · coronal · 0.67mm/px · 3 of 82 slices shown]
[im 28/82  soft-tissue]
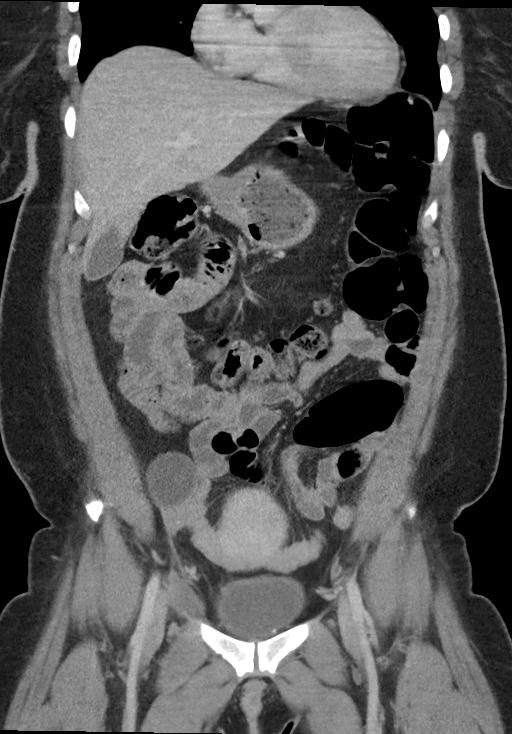
[im 37/82  soft-tissue]
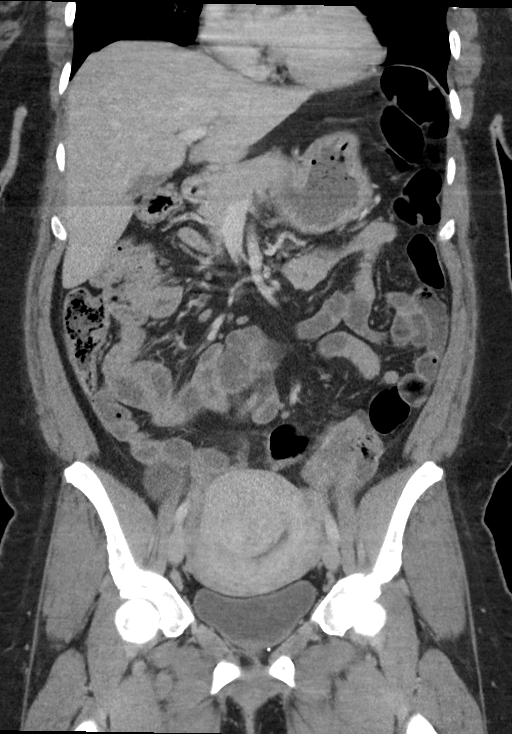
[im 46/82  soft-tissue]
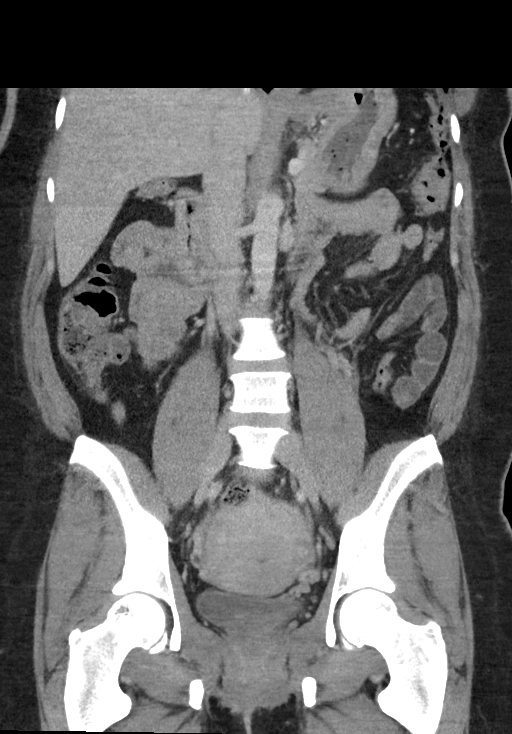

[15 of 46 positions shown; findings below may reference images not displayed]

FINDINGS: Lower chest: Motion artifact limits examination. Lung bases appear
clear.

Hepatobiliary: No focal liver abnormality is seen. No gallstones,
gallbladder wall thickening, or biliary dilatation.

Pancreas: Unremarkable. No pancreatic ductal dilatation or
surrounding inflammatory changes.

Spleen: Normal in size without focal abnormality.

Adrenals/Urinary Tract: Adrenal glands are unremarkable. Kidneys are
normal, without renal calculi, focal lesion, or hydronephrosis.
Bladder is unremarkable.

Stomach/Bowel: Stomach, small bowel, and colon are not abnormally
distended. No wall thickening or inflammatory changes are
appreciated. The appendix is normal.

Vascular/Lymphatic: Normal caliber abdominal aorta. Scattered
retroperitoneal and mesenteric lymph nodes without pathologic
enlargement, likely reactive.

Reproductive: Nodular enlargement of the uterus consistent with
uterine fibroids. Largest measures about 6.4 cm in diameter. Right
adnexal cyst measuring 4.4 cm diameter.

Other: No free air or free fluid in the abdomen. Moderate
supraumbilical anterior abdominal wall hernias containing fat.
Heterogeneous mass like soft tissue lesion demonstrated in the right
inguinal canal, measuring 3 x 4.6 cm. This could represent a hernia
with fat necrosis, enlarged lymph node, or an inguinal mass lesion.
No adjacent bowel is identified to suggest bowel herniation.

Musculoskeletal: No acute or significant osseous findings.
IMPRESSION: 1. Moderate supraumbilical anterior abdominal wall hernias
containing fat.
2. Heterogeneous mass like soft tissue lesion in the right inguinal
canal, measuring 3 x 4.6 cm. This could represent a hernia with fat
necrosis, enlarged lymph node, or an inguinal mass lesion. Surgical
consultation suggested.
3. Uterine fibroids.
4. Right adnexal cyst measuring 4.4 cm diameter. No follow-up
imaging recommended. note: This recommendation does not apply to
premenarchal patients and to those with increased risk (genetic,
family history, elevated tumor markers or other high-risk factors)
of ovarian cancer. Reference: JACR [DATE]):[id number]

## 2023-06-04 ENCOUNTER — Ambulatory Visit
Admission: EM | Admit: 2023-06-04 | Discharge: 2023-06-04 | Disposition: A | Discharge status: Home / Self Care | Payer: BC Managed Care – PPO

## 2023-06-04 DIAGNOSIS — R110 Nausea: Secondary | ICD-10-CM

## 2023-06-04 DIAGNOSIS — I10 Essential (primary) hypertension: Secondary | ICD-10-CM

## 2023-06-04 DIAGNOSIS — R197 Diarrhea, unspecified: Secondary | ICD-10-CM

## 2023-06-04 DIAGNOSIS — R1030 Lower abdominal pain, unspecified: Secondary | ICD-10-CM

## 2023-06-04 MED ORDER — ONDANSETRON 4 MG PO TBDP: 4.0000 mg | ORAL_TABLET | Freq: Four times a day (QID) | ORAL | 0 refills | Status: AC | PRN

## 2023-06-04 NOTE — ED Provider Notes (Signed)
[facility]    CSN: [id number] Arrival date & time: 06/04/23  1204      History   Chief Complaint Chief Complaint  Patient presents with   Abdominal Pain        Diarrhea   Nausea    HPI Nicole [name] is a 42 y.o. female.  Patient reports abdominal cramping pains since last night. Pain is intermittent, improved from yesterday. Has had nausea and diarrhea. No fever, chills, dysuria, urgency, flank pain.   Patient has not taken any medicine for symptoms.    Patient reports elevated blood pressure readings since yesterday. Checked BP when she had a headache. Reports a lot of stress related to her car being in the shop. Reports she wants her PCP to fill out FMLA forms, but she won't. She says she was advised to follow up with cardiologist and a psychiatrist. Has been out of work since February 2024. She does not have any upcoming appointments.   HPI  Past Medical History:  Diagnosis Date   Anemia    COVID-19    Fibroid, uterine    Hypertension    Inguinal hernia    Menorrhagia    Ovarian cyst     Patient Active Problem List   Diagnosis Date Noted   Menorrhagia with regular cycle 12/26/2021    Past Surgical History:  Procedure Laterality Date   LAPAROSCOPIC BILATERAL SALPINGECTOMY Bilateral 05/12/2021   Procedure: LAPAROSCOPIC BILATERAL SALPINGECTOMY;  Surgeon: Schermerhorn, Ihor Austin, MD;  Location: [facility];  Service: Gynecology;  Laterality: Bilateral;   LAPAROSCOPIC SUPRACERVICAL HYSTERECTOMY N/A 05/12/2021   Procedure: LAPAROSCOPIC SUPRACERVICAL HYSTERECTOMY;  Surgeon: Schermerhorn, Ihor Austin, MD;  Location: [facility];  Service: Gynecology;  Laterality: N/A;   OVARIAN CYST SURGERY Left     OB History   No obstetric history on file.      Home Medications    Prior to Admission medications   Medication Sig Start Date End Date Taking? Authorizing Provider  diclofenac Sodium (VOLTAREN) 1 % GEL Apply 4 g topically 4 (four) times daily. 04/26/22   Yes Becky Augusta, NP  fluticasone (FLONASE) 50 MCG/ACT nasal spray Place 1 spray into both nostrils 2 (two) times daily. 11/29/21  Yes Amyot, Ali Lowe, NP  hydrochlorothiazide (HYDRODIURIL) 25 MG tablet Take 1 tablet (25 mg total) by mouth daily. 05/10/22  Yes Becky Augusta, NP  hydrOXYzine (ATARAX) 25 MG tablet 1 tab(s) orally reduce dose to half tablet or 12.5 mg PRN anxiety or sleep 10/24/22  Yes [provider]  irbesartan (AVAPRO) 300 MG tablet Take 300 mg by mouth daily. 12/14/21  Yes [provider]  ondansetron (ZOFRAN-ODT) 4 MG disintegrating tablet Take 1 tablet (4 mg total) by mouth every 6 (six) hours as needed for up to 3 days for nausea or vomiting. 06/04/23 06/07/23 Yes Shirlee Latch, PA-C  pantoprazole (PROTONIX) 40 MG tablet Take 1 tablet (40 mg total) by mouth 2 (two) times daily before a meal. For up to 2 weeks then once daily 12/26/21  Yes Amyot, Ali Lowe, NP    Family History Family History  Problem Relation Age of Onset   Healthy Mother     Social History Social History   Tobacco Use   Smoking status: Every Day    Current packs/day: 0.50    Types: Cigars, Cigarettes   Smokeless tobacco: Never  Vaping Use   Vaping status: Never Used  Substance Use Topics   Alcohol use: Yes    Comment: on weekends  Drug use: Never     Allergies   Bee pollen and Hornet venom   Review of Systems Review of Systems  Constitutional:  Negative for appetite change, fatigue and fever.  Eyes:  Negative for visual disturbance.  Respiratory:  Negative for shortness of breath.   Cardiovascular:  Negative for chest pain and palpitations.  Gastrointestinal:  Positive for abdominal pain, diarrhea and nausea. Negative for vomiting.  Genitourinary:  Negative for difficulty urinating, dysuria, flank pain, pelvic pain and vaginal discharge.  Musculoskeletal:  Negative for back pain.  Neurological:  Positive for headaches. Negative for dizziness, syncope and weakness.      Physical Exam Triage Vital Signs ED Triage Vitals  Encounter Vitals Group     BP 06/04/23 1300 (!) 153/103     Systolic BP Percentile --      Diastolic BP Percentile --      Pulse Rate 06/04/23 1300 66     Resp 06/04/23 1300 16     Temp 06/04/23 1300 98.8 F (37.1 C)     Temp Source 06/04/23 1300 Oral     SpO2 06/04/23 1300 97 %     Weight 06/04/23 1259 218 lb (98.9 kg)     Height 06/04/23 1259 5' 5.5" (1.664 m)     Head Circumference --      Peak Flow --      Pain Score 06/04/23 1303 5     Pain Loc --      Pain Education --      Exclude from Growth Chart --    No data found.  Updated Vital Signs BP (!) 148/98 (BP Location: Left Arm)   Pulse 66   Temp 98.8 F (37.1 C) (Oral)   Resp 16   Ht 5' 5.5" (1.664 m)   Wt 218 lb (98.9 kg)   LMP 04/20/2021   SpO2 97%   BMI 35.73 kg/m   Physical Exam Vitals and nursing note reviewed.  Constitutional:      General: She is not in acute distress.    Appearance: Normal appearance. She is not ill-appearing or toxic-appearing.  HENT:     Head: Normocephalic and atraumatic.     Nose: Nose normal.     Mouth/Throat:     Mouth: Mucous membranes are moist.     Pharynx: Oropharynx is clear.  Eyes:     General: No scleral icterus.       Right eye: No discharge.        Left eye: No discharge.     Conjunctiva/sclera: Conjunctivae normal.  Cardiovascular:     Rate and Rhythm: Normal rate and regular rhythm.     Heart sounds: Normal heart sounds.  Pulmonary:     Effort: Pulmonary effort is normal. No respiratory distress.     Breath sounds: Normal breath sounds.  Abdominal:     Tenderness: There is abdominal tenderness in the left lower quadrant. There is no right CVA tenderness, left CVA tenderness, guarding or rebound.  Musculoskeletal:     Cervical back: Neck supple.  Skin:    General: Skin is dry.  Neurological:     General: No focal deficit present.     Mental Status: She is alert. Mental status is at baseline.      Motor: No weakness.     Gait: Gait normal.  Psychiatric:        Mood and Affect: Mood normal.        Behavior: Behavior normal.  Thought Content: Thought content normal.      UC Treatments / Results  Labs (all labs ordered are listed, but only abnormal results are displayed) Labs Reviewed - No data to display  EKG   Radiology No results found.  Procedures Procedures (including critical care time)  Medications Ordered in UC Medications - No data to display  Initial Impression / Assessment and Plan / UC Course  I have reviewed the triage vital signs and the nursing notes.  Pertinent labs & imaging results that were available during my care of the patient were reviewed by me and considered in my medical decision making (see chart for details).   42 year old female presents for abdominal cramping and diarrhea with nausea since yesterday.  Patient reports making a cheese steak before onset of symptoms.  She says the meal seem to be fully cooked.  She reports that her symptoms are better today but she has continued nausea.  BP 153/103.  Recheck 148/98.  Patient takes hydrochlorothiazide and has not missed any doses.  She also takes irbesartan.   On exam she has a clear chest with heart regular rate and rhythm.  Abdomen soft and mild tenderness palpation of the left lower quadrant.  No CVA tenderness.  No guarding or rebound.  Symptoms consistent with gastroenteritis versus food poisoning.  Declines dicyclomine or medication for discomfort and just request something for nausea.  Sent Zofran to pharmacy.  Encouraged increasing rest and fluids.  Advised to continue taking blood pressure medications and keep a log.  To follow-up with PCP if blood pressures continue to be elevated.   Final Clinical Impressions(s) / UC Diagnoses   Final diagnoses:  Lower abdominal pain  Nausea  Diarrhea, unspecified type  Essential hypertension     Discharge Instructions      ABDOMINAL  PAIN: You may take Tylenol for pain relief. Use medications as directed including antiemetics and antidiarrheal medications if suggested or prescribed. You should increase fluids and electrolytes as well as rest over these next several days. If you have any questions or concerns, or if your symptoms are not improving or if especially if they acutely worsen, please call or stop back to the clinic immediately and we will be happy to help you or go to the ER   ABDOMINAL PAIN RED FLAGS: Seek immediate further care if: symptoms remain the same or worsen over the next 3-7 days, you are unable to keep fluids down, you see blood or mucus in your stool, you vomit black or dark red material, you have a fever of 101.F or higher, you have localized and/or persistent abdominal pain       ED Prescriptions     Medication Sig Dispense Auth. Provider   ondansetron (ZOFRAN-ODT) 4 MG disintegrating tablet Take 1 tablet (4 mg total) by mouth every 6 (six) hours as needed for up to 3 days for nausea or vomiting. 12 tablet Gareth Morgan      PDMP not reviewed this encounter.   Shirlee Latch, PA-C 06/04/23 [phone]

## 2023-06-04 NOTE — Discharge Instructions (Signed)
ABDOMINAL PAIN: You may take Tylenol for pain relief. Use medications as directed including antiemetics and antidiarrheal medications if suggested or prescribed. You should increase fluids and electrolytes as well as rest over these next several days. If you have any questions or concerns, or if your symptoms are not improving or if especially if they acutely worsen, please call or stop back to the clinic immediately and we will be happy to help you or go to the ER   ABDOMINAL PAIN RED FLAGS: Seek immediate further care if: symptoms remain the same or worsen over the next 3-7 days, you are unable to keep fluids down, you see blood or mucus in your stool, you vomit black or dark red material, you have a fever of 101.F or higher, you have localized and/or persistent abdominal pain   

## 2023-06-04 NOTE — ED Triage Notes (Signed)
Pt c/o abd pain,nausea & diarrhea x1 day. States pain comes & goes. Started after eating cheese steak last night. Denies any emesis.
